# Patient Record
Sex: Male | Born: 1975 | Race: White | Hispanic: No | Smoking: Former smoker
Health system: Southern US, Community
[De-identification: ages and names within clinical notes are randomized; demographics above are authoritative.]

## PROBLEM LIST (undated history)

## (undated) ENCOUNTER — Ambulatory Visit: Admission: EM

## (undated) DIAGNOSIS — F319 Bipolar disorder, unspecified: Secondary | ICD-10-CM

## (undated) DIAGNOSIS — G8929 Other chronic pain: Secondary | ICD-10-CM

## (undated) DIAGNOSIS — M549 Dorsalgia, unspecified: Secondary | ICD-10-CM

## (undated) DIAGNOSIS — G35 Multiple sclerosis: Secondary | ICD-10-CM

## (undated) DIAGNOSIS — J40 Bronchitis, not specified as acute or chronic: Secondary | ICD-10-CM

## (undated) DIAGNOSIS — F418 Other specified anxiety disorders: Secondary | ICD-10-CM

## (undated) DIAGNOSIS — R110 Nausea: Secondary | ICD-10-CM

## (undated) HISTORY — DX: Other specified anxiety disorders: F41.8

## (undated) HISTORY — PX: OTHER SURGICAL HISTORY: SHX169

## (undated) HISTORY — DX: Other chronic pain: G89.29

## (undated) HISTORY — DX: Bronchitis, not specified as acute or chronic: J40

## (undated) HISTORY — DX: Nausea: R11.0

## (undated) HISTORY — DX: Dorsalgia, unspecified: M54.9

---

## 2003-11-02 ENCOUNTER — Encounter: Admission: RE | Admit: 2003-11-02 | Discharge: 2003-11-02 | Payer: Self-pay | Admitting: Family Medicine

## 2004-06-20 ENCOUNTER — Ambulatory Visit: Payer: Self-pay | Admitting: Family Medicine

## 2004-07-16 ENCOUNTER — Ambulatory Visit: Payer: Self-pay | Admitting: Family Medicine

## 2004-08-06 ENCOUNTER — Ambulatory Visit: Payer: Self-pay | Admitting: Family Medicine

## 2005-01-15 ENCOUNTER — Ambulatory Visit: Payer: Self-pay | Admitting: Family Medicine

## 2005-05-14 ENCOUNTER — Ambulatory Visit: Payer: Self-pay | Admitting: Family Medicine

## 2005-06-19 ENCOUNTER — Ambulatory Visit: Payer: Self-pay | Admitting: Family Medicine

## 2005-07-27 ENCOUNTER — Ambulatory Visit: Payer: Self-pay | Admitting: Family Medicine

## 2006-01-05 ENCOUNTER — Ambulatory Visit: Payer: Self-pay | Admitting: Family Medicine

## 2006-01-19 ENCOUNTER — Ambulatory Visit: Payer: Self-pay | Admitting: Family Medicine

## 2006-02-12 ENCOUNTER — Encounter: Admission: RE | Admit: 2006-02-12 | Discharge: 2006-02-12 | Payer: Self-pay | Admitting: *Deleted

## 2006-02-17 ENCOUNTER — Encounter (INDEPENDENT_AMBULATORY_CARE_PROVIDER_SITE_OTHER): Payer: Self-pay | Admitting: *Deleted

## 2006-02-17 ENCOUNTER — Ambulatory Visit (HOSPITAL_COMMUNITY): Admission: RE | Admit: 2006-02-17 | Discharge: 2006-02-17 | Payer: Self-pay | Admitting: Neurology

## 2006-02-23 ENCOUNTER — Encounter: Admission: RE | Admit: 2006-02-23 | Discharge: 2006-02-23 | Payer: Self-pay | Admitting: Neurology

## 2007-01-14 DIAGNOSIS — K219 Gastro-esophageal reflux disease without esophagitis: Secondary | ICD-10-CM

## 2007-08-13 IMAGING — CR DG CHEST 2V
2 series · 2 of 2 positions shown · non-contrast
Comparison: None

CLINICAL DATA: Bilateral arm and leg numbness.

CHEST - 2 VIEW

[view not recorded (1 of 2)]
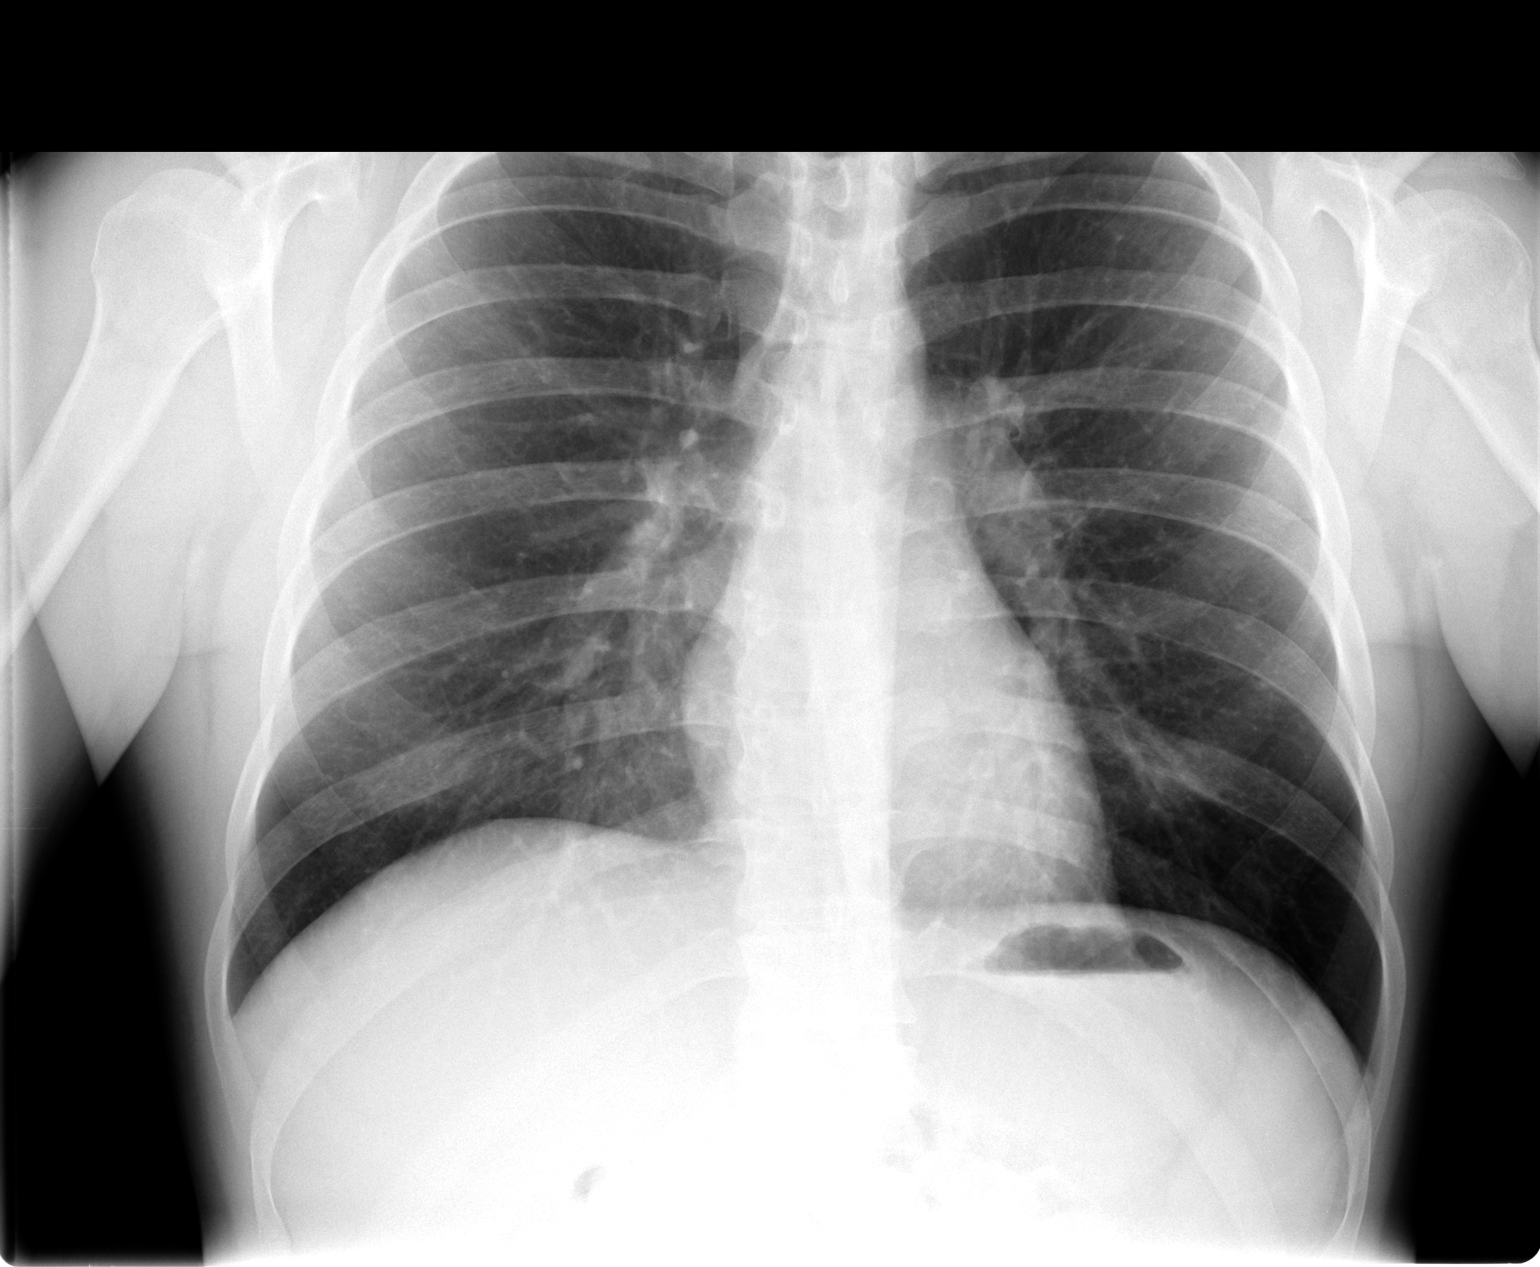

[view not recorded (2 of 2)]
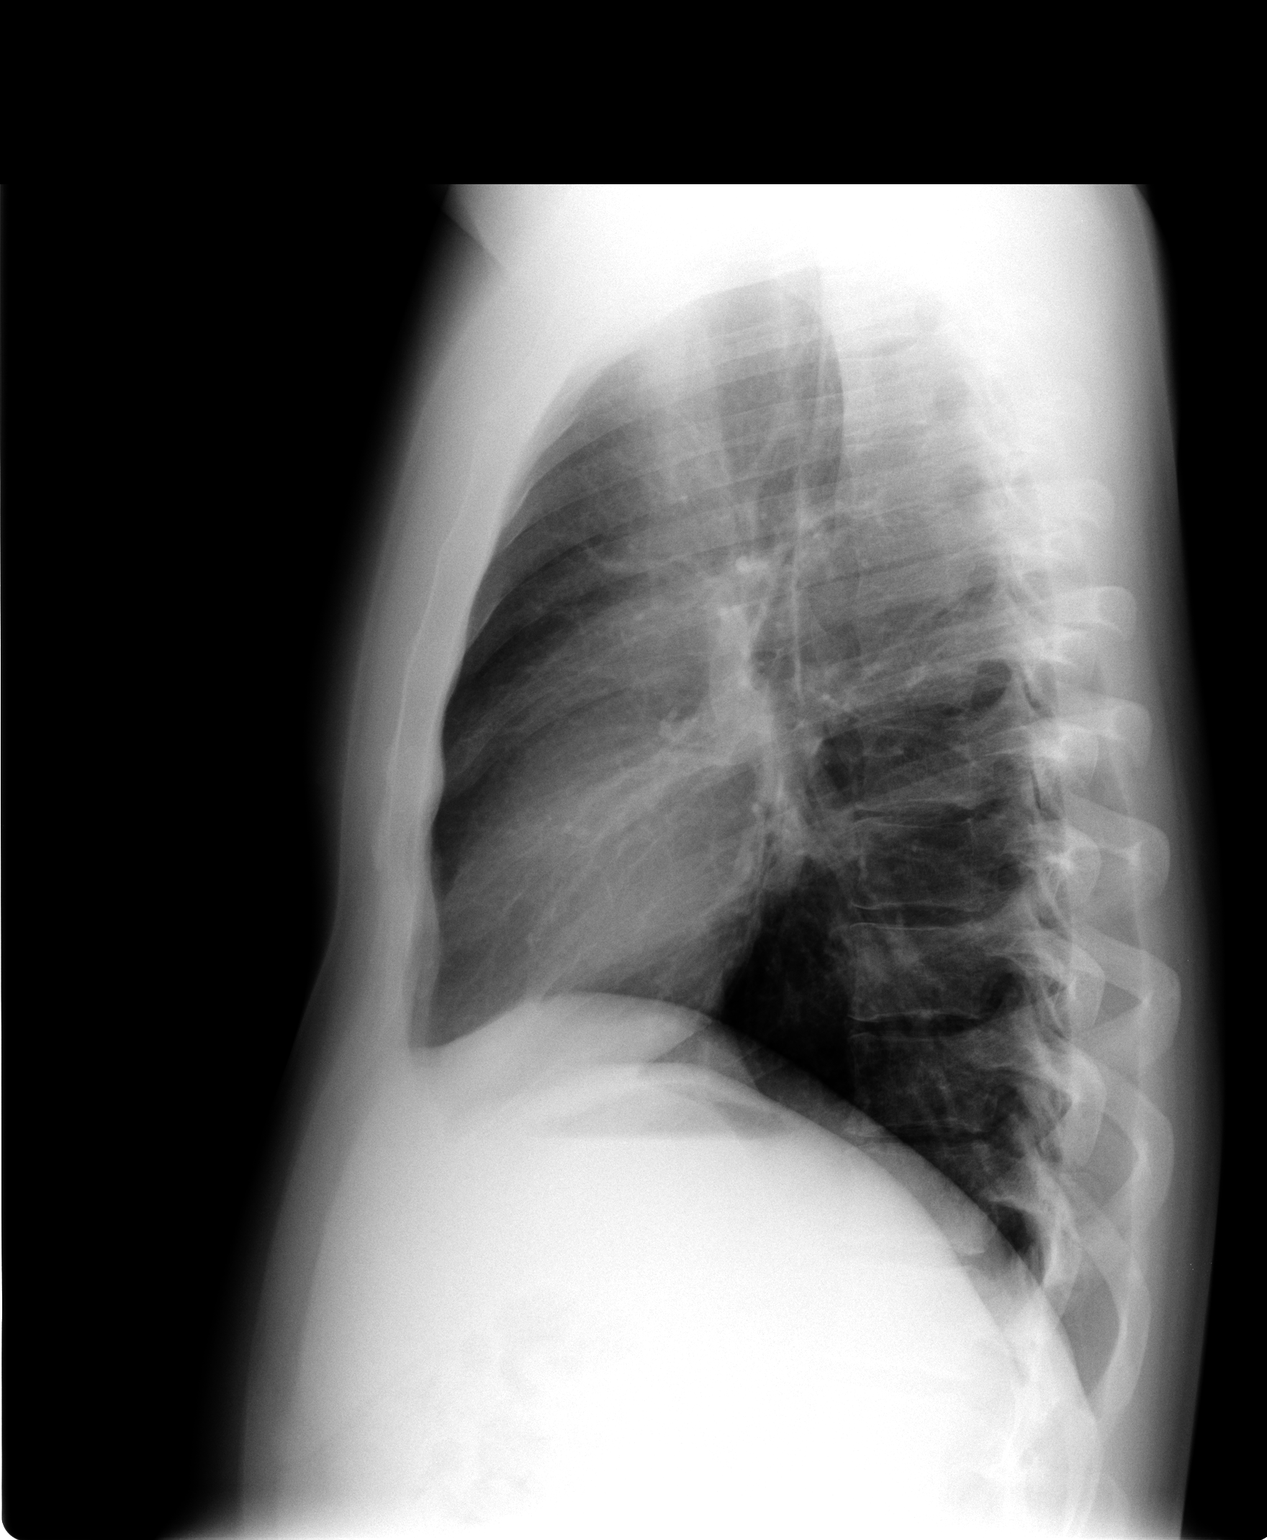

[2 of 2 positions shown; findings below may reference images not displayed]

FINDINGS: The lungs appear clear. The heart and mediastinum appear
unremarkable. No acute thoracic findings are detected.

IMPRESSION

No active cardiopulmonary disease is radiographically apparent.

## 2010-10-17 NOTE — Op Note (Signed)
NAME:  Colin Phillips, Colin Phillips          ACCOUNT NO.:  [id number]   MEDICAL RECORD NO.:  [id number]          PATIENT TYPE:  OUT   LOCATION:  [facility]                          FACILITY:  [facility]   PHYSICIAN:  Genene Churn. Love, M.D.    DATE OF BIRTH:  1975/12/09   DATE OF PROCEDURE:  02/17/2006  DATE OF DISCHARGE:                                 OPERATIVE REPORT   PROCEDURE NOTE:  The patient was prepped and draped in left lateral position  using Betadine and 1% Xylocaine. The L4-L5 interspace was entered without  difficulty.  Opening pressures 160 mm H2O.  Clear colorless CSF was obtained  and sent for studies including protein, glucose, cell count, dif,  angiotensin converting enzyme, IgG, oligoclonal IgG and cytologies.  The  patient tolerated the procedure well.           ______________________________  Genene Churn. Sandria Manly, M.D.     JML/MEDQ  D:  02/17/2006  T:  02/18/2006  Job:  [id number]

## 2011-01-17 ENCOUNTER — Emergency Department (HOSPITAL_COMMUNITY)
Admission: EM | Admit: 2011-01-17 | Discharge: 2011-01-17 | Disposition: A | Discharge status: Home / Self Care | Payer: Self-pay | Attending: Emergency Medicine | Admitting: Emergency Medicine

## 2011-01-17 ENCOUNTER — Emergency Department (HOSPITAL_COMMUNITY): Payer: Self-pay

## 2011-01-17 ENCOUNTER — Encounter: Payer: Self-pay | Admitting: *Deleted

## 2011-01-17 DIAGNOSIS — Y92009 Unspecified place in unspecified non-institutional (private) residence as the place of occurrence of the external cause: Secondary | ICD-10-CM | POA: Insufficient documentation

## 2011-01-17 DIAGNOSIS — M79609 Pain in unspecified limb: Secondary | ICD-10-CM | POA: Insufficient documentation

## 2011-01-17 DIAGNOSIS — S60229A Contusion of unspecified hand, initial encounter: Secondary | ICD-10-CM | POA: Insufficient documentation

## 2011-01-17 DIAGNOSIS — S6990XA Unspecified injury of unspecified wrist, hand and finger(s), initial encounter: Secondary | ICD-10-CM

## 2011-01-17 DIAGNOSIS — IMO0002 Reserved for concepts with insufficient information to code with codable children: Secondary | ICD-10-CM | POA: Insufficient documentation

## 2011-01-17 DIAGNOSIS — M7989 Other specified soft tissue disorders: Secondary | ICD-10-CM | POA: Insufficient documentation

## 2011-01-17 HISTORY — DX: Multiple sclerosis: G35

## 2011-01-17 HISTORY — DX: Bipolar disorder, unspecified: F31.9

## 2011-01-17 MED ORDER — OXYCODONE-ACETAMINOPHEN 5-325 MG PO TABS: 1.0000 | ORAL_TABLET | ORAL | Status: AC | PRN

## 2011-01-17 NOTE — ED Notes (Signed)
Pt states he was upset last pm and punched a wall with his right hand.

## 2011-01-17 NOTE — ED Provider Notes (Signed)
Scribed for [employer]. Colin Payor, MD, the patient was seen in room APA03/APA03. This chart was scribed by Colin Phillips. This patient's care was started at 8:57AM.  History     CSN: [id number] Arrival date & time: 01/17/2011  8:55 AM  Chief Complaint  Patient presents with  . Hand Injury   The history is provided by the patient.   Colin Phillips is a 35 y.o. male who presents to the Emergency Department complaining of a right hand injury resulting from punching the wall last night with associated pain and swelling, which is aggravated by palpations over the 4th and 5th metacarpals of the right hand. Denies numbness and tingling on the right hand. Patient has a history of multiple sclerosis.   HPI ELEMENTS: Location: 4th and 5th metacarpals of the right hand. Onset: Last night Duration: Persistent since the onset.  Timing: Constant  Modifying factors: Palpation on the4th and 5th metacarpals of the right hand. Context: as above  Associated symptoms: Associated pain and swelling, denies numbness and tingling on the right hand.    PAST MEDICAL HISTORY:  Past Medical History  Diagnosis Date  . Multiple sclerosis   . Bipolar 1 disorder     PAST SURGICAL HISTORY:  History reviewed. No pertinent past surgical history.  MEDICATIONS:  Previous Medications   ACETAMINOPHEN (TYLENOL) 325 MG TABLET    Take 650 mg by mouth every 6 (six) hours as needed. For pain    ALPRAZOLAM (XANAX) 1 MG TABLET    Take 1 mg by mouth 3 (three) times daily as needed. For anxiety   INTERFERON BETA-1A (AVONEX PEN) 30 MCG/0.5ML INJECTION    Inject 30 mcg into the muscle every 7 (seven) days.       ALLERGIES:  Allergies as of 01/17/2011  . (No Known Allergies)     FAMILY HISTORY:  Family History  Problem Relation Age of Onset  . Cancer Mother      SOCIAL HISTORY: History   Social History  . Marital Status: Single    Spouse Name: N/A    Number of Children: N/A  . Years of Education: N/A     Social History Main Topics  . Smoking status: Former Smoker    Types: Cigarettes  . Smokeless tobacco: None  . Alcohol Use: No  . Drug Use: No  . Sexually Active:    Other Topics Concern  . None   Social History Narrative  . None     Review of Systems  10 Systems reviewed and are negative for acute change except as noted in the HPI.   Physical Exam  BP 131/83  Pulse 90  Temp(Src) 98.3 F (36.8 C) (Oral)  Resp 16  Ht 5\' 7"  (1.702 m)  Wt 180 lb (81.647 kg)  BMI 28.19 kg/m2  SpO2 99%  Physical Exam  Nursing note and vitals reviewed. Constitutional: He is oriented to person, place, and time. He appears well-developed and well-nourished. No distress.  HENT:  Head: Normocephalic and atraumatic.  Eyes: EOM are normal.  Neck: Neck supple.  Cardiovascular: Normal rate.   Pulmonary/Chest: Effort normal.  Musculoskeletal: Normal range of motion. He exhibits tenderness.       Right wrist: He exhibits tenderness and swelling.       Tenderness and swelling on the 4th and 5th metacarpals of the right hand. Mild bruising to the right hand. No pain with ROM of the right wrist. No pain in the ROM of the elbow. Sensation and cap refill  intact in the right hand.  Neurological: He is alert and oriented to person, place, and time. No sensory deficit.  Skin: Skin is warm and dry. He is not diaphoretic.  Psychiatric: He has a normal mood and affect. His behavior is normal.    ED Course  Procedures  MDM OTHER DATA REVIEWED: Nursing notes, vital signs, and past medical records reviewed. Imaging results reviewed and considered  DIAGNOSTIC STUDIES: Oxygen Saturation is 99% on room air, normal by my interpretation.    LABS / RADIOLOGY: No results found for this or any previous visit. Dg Hand Complete Right  01/17/2011  *RADIOLOGY REPORT*  Clinical Data: Trauma, right hand pain  RIGHT HAND - COMPLETE 3+ VIEW  Comparison: None.  Findings: Mild soft tissue swelling is noted medial  to the fifth metacarpal but no underlying osseous abnormality is seen.  No fracture or dislocation.  No radiopaque foreign body.  IMPRESSION: Soft tissue swelling adjacent to the fifth metacarpal, but no underlying acute osseous finding.  Original Report Authenticated By: Colin Phillips, M.D.    PROCEDURES:  ED COURSE / COORDINATION OF CARE: Orders Placed This Encounter  Procedures  . DG Hand Complete Right  . Splint wrist  9:28- Informed patient of imaging results which did not find any osseous abnormalities. Discussed at home care instructions and intent to d/c home. Patient agrees with plan set forth   MDM: Right hand swelling after punching wall. Xray no fracture. Splint for comfort. Ortho follow up as needed.   PLAN: Discharge The patient is to return the emergency department if there is any worsening of symptoms. I have reviewed the discharge instructions with the patient/family  CONDITION ON DISCHARGE: Good, Stable   MEDICATIONS GIVEN IN THE E.D.  Medications  ALPRAZolam (XANAX) 1 MG tablet (not administered)  acetaminophen (TYLENOL) 325 MG tablet (not administered)  interferon beta-1a (AVONEX PEN) 30 MCG/0.5ML injection (not administered)     I personally performed the services described in this documentation, which was scribed in my presence. The recorded information has been reviewed and considered. [name]. Colin Payor, MD        [name]. Colin Payor, MD 01/17/11 [phone]

## 2011-01-17 NOTE — ED Notes (Signed)
Pt c/o pain in his right hand since punching a wall last night. Swelling noted to right hand. Strong radial pulse palpated. States that it hurts to move his wrist also.

## 2012-01-06 ENCOUNTER — Ambulatory Visit: Payer: Self-pay | Admitting: Family Medicine

## 2013-04-25 ENCOUNTER — Telehealth: Payer: Self-pay | Admitting: *Deleted

## 2013-04-26 MED ORDER — INTERFERON BETA-1A 30 MCG/0.5ML IM KIT: 30.0000 ug | PACK | INTRAMUSCULAR | Status: DC

## 2013-04-26 NOTE — Telephone Encounter (Signed)
Please advise 

## 2013-05-03 NOTE — Telephone Encounter (Signed)
Please advise 

## 2013-05-08 ENCOUNTER — Encounter: Payer: Self-pay | Admitting: Neurology

## 2013-05-19 ENCOUNTER — Ambulatory Visit: Payer: Self-pay | Admitting: Neurology

## 2013-06-08 ENCOUNTER — Encounter: Payer: Self-pay | Admitting: Neurology

## 2013-06-09 ENCOUNTER — Ambulatory Visit: Payer: Self-pay | Admitting: Neurology

## 2013-06-22 ENCOUNTER — Encounter (INDEPENDENT_AMBULATORY_CARE_PROVIDER_SITE_OTHER): Payer: Self-pay

## 2013-06-22 ENCOUNTER — Ambulatory Visit (INDEPENDENT_AMBULATORY_CARE_PROVIDER_SITE_OTHER): Payer: Self-pay | Admitting: Neurology

## 2013-06-22 ENCOUNTER — Encounter: Payer: Self-pay | Admitting: Neurology

## 2013-06-22 VITALS — BP 124/83 | HR 91 | Ht 66.0 in | Wt 147.0 lb

## 2013-06-22 DIAGNOSIS — G35 Multiple sclerosis: Secondary | ICD-10-CM

## 2013-06-22 DIAGNOSIS — F319 Bipolar disorder, unspecified: Secondary | ICD-10-CM

## 2013-06-22 DIAGNOSIS — K219 Gastro-esophageal reflux disease without esophagitis: Secondary | ICD-10-CM

## 2013-06-22 DIAGNOSIS — M549 Dorsalgia, unspecified: Secondary | ICD-10-CM

## 2013-06-22 DIAGNOSIS — G8929 Other chronic pain: Secondary | ICD-10-CM | POA: Insufficient documentation

## 2013-06-22 DIAGNOSIS — F418 Other specified anxiety disorders: Secondary | ICD-10-CM

## 2013-06-22 NOTE — Progress Notes (Signed)
[facility]  PATIENT: Colin Phillips DOB: 18-Oct-1975  HISTORICAL Colin Phillips is a 38 years old right-handed Caucasian male, followup for relapsing remitting multiple sclerosis, he was a patient of Colin Phillips, last clinical visit was December 2013  He has long-standing history of chronic Colin Phillips back pain and long-standing depression. His depression and bipolar disease are treated by Dr. Candis Phillips.   During previous visit, Dr. low documented that he was taking Vicodin 5/500 t.i.d. for nonradiating back pain. He states that he take as many as 10 per day. He also has a tendency to ask him for pain medications, using multiple prescribers,   He supposed to take Avonex for his multiple sclerosis, but never compliant with his medications, he was missing 12 third of his Avonex shots there was no significant worsening of his MS symptoms there was no imaging study found in the system,    He is independent in activities of daily living, He last worked in 2009. He lives with his father. 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   He finished high school.He states that he has a felony conviction  related to selling drugs when he was a teenager.   he came back complains of worsening Colin Phillips back pain, pain all over, difficlty sleep, numbness in his feet and hands, There was no significant worsening of his functional status, he drives car, ambulate without assistance, normal bowel bladder incontinence   REVIEW OF SYSTEMS: Full 14 system review of systems performed and notable only for Colin Phillips back pain, diffuse body aching pain   ALLERGIES: No Known Allergies  HOME MEDICATIONS: Outpatient Prescriptions Prior to Visit  Medication Sig Dispense Refill  . acetaminophen (TYLENOL) 325 MG tablet Take 650 mg by mouth every 6 (six) hours as needed. For pain       . ALPRAZolam (XANAX) 1 MG tablet Take 1 mg by mouth 3 (three) times daily as needed. For anxiety      . baclofen (LIORESAL) 10 MG tablet Take 10 mg  by mouth 3 (three) times daily. Take 1/2 tablet      . interferon beta-1a (AVONEX PEN) 30 MCG/0.5ML injection Inject 0.5 mLs (30 mcg total) into the muscle every 7 (seven) days.  1 kit  1  . lamoTRIgine (LAMICTAL) 100 MG tablet Take 100 mg by mouth daily.      . risperidone (RISPERDAL) 4 MG tablet Take 4 mg by mouth at bedtime.      Marland Kitchen zolpidem (AMBIEN) 10 MG tablet Take 10 mg by mouth at bedtime as needed for sleep.       No facility-administered medications prior to visit.    PAST MEDICAL HISTORY: Past Medical History  Diagnosis Date  . Multiple sclerosis   . Bipolar 1 disorder   . Chronic back pain   . Depression with anxiety   . Nausea     PAST SURGICAL HISTORY: Past Surgical History  Procedure Laterality Date  . None      FAMILY HISTORY: Family History  Problem Relation Age of Onset  . Cancer Mother   . Cancer      grandfather  . Cancer Cousin     SOCIAL HISTORY:  History   Social History  . Marital Status: Single    Spouse Name: N/A    Number of Children: 0  . Years of Education: 12   Occupational History  .      unemployed   Social History Main Topics  . Smoking status: Current Every Day  Smoker    Types: Cigars  . Smokeless tobacco: Never Used  . Alcohol Use: No  . Drug Use: No  . Sexual Activity: Not on file   Other Topics Concern  . Not on file   Social History Narrative   Patient is unemployed.    Patient lives at home with his father, niece and nephew.   Patient is right handed.   Patient drinks sodas mountain dews     PHYSICAL EXAM   Filed Vitals:   06/22/13 1217  BP: 124/83  Pulse: 91  Height: '5\' 6"'  (1.676 m)  Weight: 147 lb (66.679 kg)    Not recorded    Body mass index is 23.74 kg/(m^2).   Generalized: In no acute distress  Neck: Supple, no carotid bruits   Cardiac: Regular rate rhythm  Pulmonary: Clear to auscultation bilaterally  Musculoskeletal: No deformity  Neurological examination  Mentation: Alert  oriented to time, place, history taking, and causual conversation, depressed, unkempted  Cranial nerve II-XII: Pupils were equal round reactive to light extraocular movements were full, Visual field were full on confrontational test. Bilateral fundi were sharp.  Facial sensation and strength were normal. Hearing was intact to finger rubbing bilaterally. Uvula tongue midline.  head turning and shoulder shrug and were normal and symmetric.Tongue protrusion into cheek strength was normal.  Motor: normal tone, bulk and strength.  Sensory: Intact to fine touch, pinprick, preserved vibratory sensation, and proprioception at toes.  Coordination: Normal finger to nose, heel-to-shin bilaterally there was no truncal ataxia  Gait: Rising up from seated position without assistance, normal stance, cautious, guarding his back, without trunk ataxia, moderate stride, good arm swing, smooth turning, able to perform tiptoe, and heel walking without difficulty.   Romberg signs: Negative  Deep tendon reflexes: Brachioradialis 2/2, biceps 2/2, triceps 2/2, patellar 2/2, Achilles 2/2, plantar responses were flexor bilaterally.   DIAGNOSTIC DATA (LABS, IMAGING, TESTING) - I reviewed patient records, labs, notes, testing and imaging myself where available.   ASSESSMENT AND PLAN   38 years old male, with history of relapsing remitting multiple sclerosis, not compliant with his Avonex shots, has not had imaging for many years, clinically stable, complains of chronic Colin Phillips back pain,     1 Before committed him to long-term immunomodulation therapy, I will repeat MRI of the brain, and cervical spine with and without contrast 2 return to clinic in one month to review the film, and decide on treatment plan    Marcial Pacas, M.D. Ph.D.  [facility] [address], [place], [place] [address] [phone]

## 2013-10-13 ENCOUNTER — Telehealth: Payer: Self-pay | Admitting: Neurology

## 2013-10-13 NOTE — Telephone Encounter (Signed)
Pt calling stating that he wanted to make Dr. Krista Blue aware that he is having numbness in his hands and feet and also inquiring about his Avonex refill. Please advise

## 2013-10-13 NOTE — Telephone Encounter (Signed)
Patient says he is having numbness in hands and feet and wanted to make provider aware. Avonex has been refilled.

## 2013-10-13 NOTE — Telephone Encounter (Signed)
Please call him,    I saw him in Jan 2015, suppose to come back in one month per office note, the appt is in Oct 2015, please call patient, if he has problems, he should have MRI brain and cervical as scheduled and return soon to review films for further discussion

## 2013-10-13 NOTE — Telephone Encounter (Signed)
Patient called stating that he is having numbness in his hands and feet and just wanted to be sure that Dr. Krista Blue is aware. He also is asking about whether his Avonex refill has been approved. Please call to advise.

## 2013-10-17 NOTE — Telephone Encounter (Signed)
Pt called and LMVM for him re: below.  Needing MRI brain and C spine prior to being seen.  Spoke to [place] in [place].  She stated that pt was contacted by [facility] about scheduling.  Cost factor since pt self pay.  MS society?  Awaiting pts call back.

## 2013-10-19 NOTE — Telephone Encounter (Signed)
I called and LMVM for pt again.  To call back if still needed.

## 2013-12-21 ENCOUNTER — Ambulatory Visit: Payer: Self-pay | Admitting: Neurology

## 2014-03-13 ENCOUNTER — Ambulatory Visit: Payer: Self-pay | Admitting: Neurology

## 2014-09-12 ENCOUNTER — Telehealth: Payer: Self-pay | Admitting: Neurology

## 2014-09-12 DIAGNOSIS — G35 Multiple sclerosis: Secondary | ICD-10-CM

## 2014-09-12 NOTE — Telephone Encounter (Signed)
Pt is calling stating he just got insurance and wants to see about getting his MRI's rescheduled. Please call and advise.

## 2014-09-12 NOTE — Telephone Encounter (Signed)
He will need new orders for MRIs.  He has a pending follow up appt on 10/11/14.

## 2014-09-13 NOTE — Telephone Encounter (Signed)
MRI brain and cervical w/wo were placed.

## 2014-09-13 NOTE — Addendum Note (Signed)
Addended by: Marcial Pacas on: 09/13/2014 08:34 AM   Modules accepted: Orders

## 2014-09-21 ENCOUNTER — Ambulatory Visit
Admission: RE | Admit: 2014-09-21 | Discharge: 2014-09-21 | Disposition: A | Discharge status: Home / Self Care | Payer: 59 | Source: Ambulatory Visit | Attending: Neurology | Admitting: Neurology

## 2014-09-21 DIAGNOSIS — G35 Multiple sclerosis: Secondary | ICD-10-CM

## 2014-09-21 MED ORDER — GADOBENATE DIMEGLUMINE 529 MG/ML IV SOLN
15.0000 mL | Freq: Once | INTRAVENOUS | Status: AC | PRN
  Administered 2014-09-21: 15 mL via INTRAVENOUS

## 2014-09-24 NOTE — Telephone Encounter (Signed)
Spoke to patient concerning MRI.  He will keep his follow up appt to further discuss.r

## 2014-09-24 NOTE — Telephone Encounter (Signed)
Please call patient,   MRI brain: mild low lying brain, otherwise normal.  MRI cervical spine:  Single lesion at posterior C4. Otherwise normal.  He should keep follow up appt.  MRI of the cervical spine with and without contrast showing a single focus within the posterior spinal cord adjacent to the C4 vertebral body. This could be consistent with a chronic demyelinating plaque from transverse myelitis or MS. There are no acute findings. Additionally, there are mild disc degenerative changes at C4-C5 and C5-C6 that do not lead to any nerve root impingement.  1. 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 2. The brain parenchyma appears normal. There are no acute findings.This MRI of the brain with and without contrast shows the following: 1. 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 2. The brain parenchyma appears normal. There are no acute findings.

## 2014-10-11 ENCOUNTER — Other Ambulatory Visit: Payer: Self-pay | Admitting: *Deleted

## 2014-10-11 ENCOUNTER — Ambulatory Visit (INDEPENDENT_AMBULATORY_CARE_PROVIDER_SITE_OTHER): Payer: 59 | Admitting: Neurology

## 2014-10-11 ENCOUNTER — Encounter: Payer: Self-pay | Admitting: Neurology

## 2014-10-11 VITALS — BP 126/83 | HR 91 | Ht 66.0 in | Wt 159.0 lb

## 2014-10-11 DIAGNOSIS — G35 Multiple sclerosis: Secondary | ICD-10-CM

## 2014-10-11 DIAGNOSIS — R350 Frequency of micturition: Secondary | ICD-10-CM

## 2014-10-11 MED ORDER — GABAPENTIN 300 MG PO CAPS: 300.0000 mg | ORAL_CAPSULE | Freq: Three times a day (TID) | ORAL | Status: DC

## 2014-10-11 NOTE — Progress Notes (Signed)
Chief Complaint  Patient presents with  . Multiple Sclerosis    He would like to further discuss his MRI results. He has multiple concerns today.  He is having headaches, numbness, tingling, muscle spasms, back pain, urinary frequency and visual disturbances.      [facility]  PATIENT: Colin Phillips DOB: 1976/03/17  HISTORICAL [name] is a 39 years old right-handed Caucasian male, followup for relapsing remitting multiple sclerosis since 2007, he was a patient of Dr. Erling Cruz, last clinical visit was December 2013, I saw him in Jan 2015.  He has long-standing history of chronic low back pain and long-standing depression. His depression and bipolar disease are treated by Dr. Candis Schatz.   During previous visit, Dr. Erling Cruz documented that he was taking Vicodin 5/500 t.i.d. for nonradiating back pain. He states that he take as many as 10 per day. He also has a tendency to ask him for pain medications, using multiple prescribers,   He supposed to take Avonex for his multiple sclerosis, but never compliant with his medications, he was missing 12 third of his Avonex shots there was no significant worsening of his MS symptoms there was no imaging study found in the system.   He is independent in activities of daily living, He last worked in 2009. He lives with his father. 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   He finished high school.He states that he has a felony conviction  related to selling drugs when he was a teenager.  He came back complains of worsening low back pain, pain all over, difficulty sleep, numbness in his feet and hands, There was no significant worsening of his functional status, he drives car, ambulate without assistance, normal bowel bladder incontinence  UPDATE Oct 11 2014: He missed few appointment since initial visit in Jan 2015, he now complains of numbness involving his hand, legs, even at his face, genital area, skull, hands achiness, thigh muscle spasm,  knee pain, xanax helps his muscle spasm. He complains of difficulty sleeping because of his low back pain, could not tolerate any strenuous activities, fatigue, feel like carry twice his weight. He also complains of urinary frequency, nocturia, he was evaluated by urologist in the past. He complains of frequent headaches, triggered by bright light, if he moves his eyes, he sees black dots moving in his visual field. He questioning wether he needs to continue on Avenox, e misses shot occasionallly, does have flu like illness, lasting for 12-24 hours  I have reviewed MRI with patient April 20 second 2016:  MRI of brain, mild Arnold-Chiari malformation, no MS lesions' MRI cervical spine, single posterior C4 lesions no contrast enhancement   REVIEW OF SYSTEMS: Full 14 system review of systems performed and notable only for low back pain, diffuse body aching pain   ALLERGIES: No Known Allergies  HOME MEDICATIONS: Outpatient Prescriptions Prior to Visit  Medication Sig Dispense Refill  . ALPRAZolam (XANAX) 1 MG tablet Take 1 mg by mouth 3 (three) times daily as needed. For anxiety    . interferon beta-1a (AVONEX PEN) 30 MCG/0.5ML injection Inject 0.5 mLs (30 mcg total) into the muscle every 7 (seven) days. 1 kit 1  . lamoTRIgine (LAMICTAL) 100 MG tablet Take 100 mg by mouth daily.    . risperidone (RISPERDAL) 4 MG tablet Take 4 mg by mouth at bedtime.    Marland Kitchen zolpidem (AMBIEN) 10 MG tablet Take 10 mg by mouth at bedtime as needed for sleep.    Marland Kitchen acetaminophen (TYLENOL) 325  MG tablet Take 650 mg by mouth every 6 (six) hours as needed. For pain     . baclofen (LIORESAL) 10 MG tablet Take 10 mg by mouth 3 (three) times daily. Take 1/2 tablet    . Hydrocodone-Acetaminophen (VICODIN) 5-300 MG TABS Take by mouth as needed.     No facility-administered medications prior to visit.    PAST MEDICAL HISTORY: Past Medical History  Diagnosis Date  . Multiple sclerosis   . Bipolar 1 disorder   . Chronic  back pain   . Depression with anxiety   . Nausea     PAST SURGICAL HISTORY: Past Surgical History  Procedure Laterality Date  . None      FAMILY HISTORY: Family History  Problem Relation Age of Onset  . Cancer Mother   . Cancer      grandfather  . Cancer Cousin     SOCIAL HISTORY:  History   Social History  . Marital Status: Single    Spouse Name: N/A  . Number of Children: 0  . Years of Education: 12   Occupational History  .      unemployed   Social History Main Topics  . Smoking status: Current Every Day Smoker    Types: Cigars  . Smokeless tobacco: Never Used  . Alcohol Use: No  . Drug Use: No  . Sexual Activity: Not on file   Other Topics Concern  . Not on file   Social History Narrative   Patient is unemployed.    Patient lives at home with his father, niece and nephew.   Patient is right handed.   Patient drinks sodas [employer]     PHYSICAL EXAM   Filed Vitals:   10/11/14 1106  BP: 126/83  Pulse: 91  Height: '5\' 6"'  (1.676 m)  Weight: 159 lb (72.122 kg)    Not recorded      Body mass index is 25.68 kg/(m^2).   Generalized: In no acute distress  Neck: Supple, no carotid bruits   Cardiac: Regular rate rhythm  Pulmonary: Clear to auscultation bilaterally  Musculoskeletal: No deformity  Neurological examination  Mentation: Alert oriented to time, place, history taking, and causual conversation, depressed, unkempted  Cranial nerve II-XII: Pupils were equal round reactive to light extraocular movements were full, Visual field were full on confrontational test. Bilateral fundi were sharp.  Facial sensation and strength were normal. Hearing was intact to finger rubbing bilaterally. Uvula tongue midline.  head turning and shoulder shrug and were normal and symmetric.Tongue protrusion into cheek strength was normal.  Motor: normal tone, bulk and strength.  Sensory: Intact to fine touch, pinprick, preserved vibratory sensation, and  proprioception at toes.  Coordination: Normal finger to nose, heel-to-shin bilaterally there was no truncal ataxia  Gait: Rising up from seated position without assistance, normal stance, cautious, guarding his back, without trunk ataxia, moderate stride, good arm swing, smooth turning, able to perform tiptoe, and heel walking without difficulty.   Romberg signs: Negative  Deep tendon reflexes: Brachioradialis 2/2, biceps 2/2, triceps 2/2, patellar 2/2, Achilles 2/2, plantar responses were flexor bilaterally.   DIAGNOSTIC DATA (LABS, IMAGING, TESTING) - I reviewed patient records, labs, notes, testing and imaging myself where available.   ASSESSMENT AND PLAN   39 years old male, with history of relapsing remitting multiple sclerosis, not compliant with his Avonex shots, MRI of the brain April 2016, with without contrast showed no MS lesions, MRI of cervical showed single isolated lesions at posterior C4, no  contrast enhancement  1, He only has one clinical event, no clear relapsing remediating over the past few years, Continue Avonex, premedicate with ibuprofen, or Tylenol 2 return to clinic in 6 months with Rhae Hammock, M.D. Ph.D.  [facility] [address] [place], [place] [address]  [phone]

## 2015-01-07 ENCOUNTER — Telehealth (HOSPITAL_COMMUNITY): Payer: Self-pay | Admitting: *Deleted

## 2015-02-05 ENCOUNTER — Telehealth: Payer: Self-pay | Admitting: Neurology

## 2015-02-05 MED ORDER — INTERFERON BETA-1A 30 MCG/0.5ML IM AJKT: 30.0000 ug | AUTO-INJECTOR | INTRAMUSCULAR | Status: DC

## 2015-02-05 NOTE — Telephone Encounter (Signed)
Rx has been sent.  Receipt confirmed by pharmacy.   

## 2015-02-05 NOTE — Telephone Encounter (Signed)
Patient is calling and states that he now has [employer] and they are telling him he needs a Rx Avonex Pen 30 mcg/0.5 sent to [facility] or called in to [phone].  Thanks!

## 2015-02-27 ENCOUNTER — Ambulatory Visit: Payer: 59 | Admitting: Urology

## 2015-04-16 ENCOUNTER — Ambulatory Visit: Payer: 59 | Admitting: Nurse Practitioner

## 2015-04-17 ENCOUNTER — Encounter: Payer: Self-pay | Admitting: Nurse Practitioner

## 2015-07-11 ENCOUNTER — Other Ambulatory Visit: Payer: Self-pay

## 2015-07-11 MED ORDER — INTERFERON BETA-1A 30 MCG/0.5ML IM AJKT: 30.0000 ug | AUTO-INJECTOR | INTRAMUSCULAR | Status: DC

## 2015-09-24 ENCOUNTER — Telehealth: Payer: Self-pay | Admitting: Neurology

## 2015-09-24 NOTE — Telephone Encounter (Signed)
[place] [phone] called to advise, they have been unable to reach patient at listed phone number, patient has not responded to their calls regarding refill of Interferon Beta-1a (AVONEX PEN) 30 MCG/0.5ML AJKT.

## 2016-02-11 ENCOUNTER — Encounter (HOSPITAL_COMMUNITY): Payer: Self-pay | Admitting: Emergency Medicine

## 2016-02-11 ENCOUNTER — Emergency Department (HOSPITAL_COMMUNITY)
Admission: EM | Admit: 2016-02-11 | Discharge: 2016-02-11 | Disposition: A | Discharge status: Home / Self Care | Payer: 59 | Attending: Emergency Medicine | Admitting: Emergency Medicine

## 2016-02-11 ENCOUNTER — Emergency Department (HOSPITAL_COMMUNITY): Payer: 59

## 2016-02-11 DIAGNOSIS — J40 Bronchitis, not specified as acute or chronic: Secondary | ICD-10-CM | POA: Insufficient documentation

## 2016-02-11 DIAGNOSIS — F17290 Nicotine dependence, other tobacco product, uncomplicated: Secondary | ICD-10-CM | POA: Insufficient documentation

## 2016-02-11 DIAGNOSIS — R0789 Other chest pain: Secondary | ICD-10-CM

## 2016-02-11 DIAGNOSIS — R079 Chest pain, unspecified: Secondary | ICD-10-CM | POA: Diagnosis present

## 2016-02-11 MED ORDER — DICLOFENAC SODIUM 50 MG PO TBEC: 50.0000 mg | DELAYED_RELEASE_TABLET | Freq: Two times a day (BID) | ORAL | 0 refills | Status: DC

## 2016-02-11 MED ORDER — AZITHROMYCIN 250 MG PO TABS: 250.0000 mg | ORAL_TABLET | Freq: Every day | ORAL | 0 refills | Status: DC

## 2016-02-11 MED ORDER — HYDROCODONE-ACETAMINOPHEN 5-325 MG PO TABS
2.0000 | ORAL_TABLET | ORAL | 0 refills | Status: DC | PRN
Start: 2016-02-11 — End: 2016-05-18

## 2016-02-11 NOTE — ED Triage Notes (Signed)
Pt c/o left rib pain after doing push ups last week. Pt states pain is worse with movement, deep breath, sneezing. nad noted.

## 2016-02-11 NOTE — ED Notes (Signed)
Patient given discharge instruction, verbalized understand. Patient ambulatory out of the department.  

## 2016-02-11 NOTE — ED Notes (Signed)
PA at the bedside to discuss plan

## 2016-02-22 NOTE — ED Provider Notes (Signed)
[facility] DEPT Provider Note   CSN: [id number] Arrival date & time: 02/11/16  1312     History   Chief Complaint Chief Complaint  Patient presents with  . Chest Pain  Pt complains of pain in his chest after doing pushups last week.  Pt reports pain is becoming worse not improving.  Pain with breathins  HPI Colin Phillips is a 40 y.o. male.  The history is provided by the patient. No language interpreter was used.  Chest Pain   This is a new problem. The current episode started more than 1 week ago. The problem occurs constantly. The problem has been gradually worsening. The pain is associated with exertion, lifting and raising an arm. The pain is moderate. The quality of the pain is described as brief. The pain does not radiate. The symptoms are aggravated by certain positions. He has tried nothing for the symptoms. The treatment provided no relief. There are no known risk factors.  Pertinent negatives for past medical history include no hypertension.    Past Medical History:  Diagnosis Date  . Bipolar 1 disorder ([place])   . Chronic back pain   . Depression with anxiety   . Multiple sclerosis ([place])   . Nausea     Patient Active Problem List   Diagnosis Date Noted  . Multiple sclerosis ([place])   . Bipolar 1 disorder ([place])   . Chronic back pain   . Depression with anxiety   . GERD 01/14/2007    Past Surgical History:  Procedure Laterality Date  . None         Home Medications    Prior to Admission medications   Medication Sig Start Date End Date Taking? Authorizing Provider  ALPRAZolam Duanne Moron) 1 MG tablet Take 1 mg by mouth 3 (three) times daily as needed. For anxiety    Historical Provider, MD  azithromycin (ZITHROMAX) 250 MG tablet Take 1 tablet (250 mg total) by mouth daily. Take first 2 tablets together, then 1 every day until finished. 02/11/16   Fransico Meadow, PA-C  azithromycin (ZITHROMAX) 250 MG tablet Take 1 tablet (250 mg total) by mouth daily.  Take first 2 tablets together, then 1 every day until finished. 02/11/16   Fransico Meadow, PA-C  diclofenac (CATAFLAM) 50 MG tablet Take 50 mg by mouth 2 (two) times daily.    Historical Provider, MD  diclofenac (VOLTAREN) 50 MG EC tablet Take 1 tablet (50 mg total) by mouth 2 (two) times daily. 02/11/16   Fransico Meadow, PA-C  gabapentin (NEURONTIN) 300 MG capsule Take 1 capsule (300 mg total) by mouth 3 (three) times daily. 10/11/14   Marcial Pacas, MD  HYDROcodone-acetaminophen (NORCO/VICODIN) 5-325 MG tablet Take 2 tablets by mouth every 4 (four) hours as needed. 02/11/16   Fransico Meadow, PA-C  ibuprofen (ADVIL,MOTRIN) 200 MG tablet Take 200 mg by mouth every 6 (six) hours as needed.    Historical Provider, MD  Interferon Beta-1a (AVONEX PEN) 30 MCG/0.5ML AJKT Inject 30 mcg into the muscle every 7 (seven) days. 07/11/15   Marcial Pacas, MD  lamoTRIgine (LAMICTAL) 100 MG tablet Take 100 mg by mouth daily.    Historical Provider, MD  risperidone (RISPERDAL) 4 MG tablet Take 4 mg by mouth at bedtime.    Historical Provider, MD  zolpidem (AMBIEN) 10 MG tablet Take 10 mg by mouth at bedtime as needed for sleep.    Historical Provider, MD    Family History Family History  Problem Relation  Age of Onset  . Cancer Mother   . Cancer      grandfather  . Cancer Cousin     Social History Social History  Substance Use Topics  . Smoking status: Current Every Day Smoker    Types: Cigars  . Smokeless tobacco: Never Used  . Alcohol use No     Allergies   Review of patient's allergies indicates no known allergies.   Review of Systems Review of Systems  Cardiovascular: Positive for chest pain.  All other systems reviewed and are negative.    Physical Exam Updated Vital Signs BP 130/88 (BP Location: Right Arm)   Pulse 91   Temp 98.8 F (37.1 C) (Oral)   Resp 20   Ht 5\' 7"  (1.702 m)   Wt 77.1 kg   SpO2 95%   BMI 26.63 kg/m   Physical Exam  Constitutional: He appears well-developed and  well-nourished.  HENT:  Head: Normocephalic and atraumatic.  Eyes: Conjunctivae are normal.  Neck: Neck supple.  Cardiovascular: Normal rate and regular rhythm.   No murmur heard. Pulmonary/Chest: Effort normal and breath sounds normal. No respiratory distress. He exhibits tenderness.  Abdominal: Soft. There is no tenderness.  Musculoskeletal: He exhibits no edema.  Neurological: He is alert.  Skin: Skin is warm and dry.  Psychiatric: He has a normal mood and affect.  Nursing note and vitals reviewed.    ED Treatments / Results  Labs (all labs ordered are listed, but only abnormal results are displayed) Labs Reviewed - No data to display  EKG  EKG Interpretation  Date/Time:  Tuesday February 11 2016 13:23:34 EDT Ventricular Rate:  102 PR Interval:  134 QRS Duration: 84 QT Interval:  336 QTC Calculation: 437 R Axis:   68 Text Interpretation:  Sinus tachycardia Otherwise normal ECG No old tracing to compare Confirmed by [facility]  MD, ELLIOTT [phone]) on 02/11/2016 1:37:22 PM       Radiology No results found.  Procedures Procedures (including critical care time)  Medications Ordered in ED Medications - No data to display   Initial Impression / Assessment and Plan / ED Course  I have reviewed the triage vital signs and the nursing notes.  Pertinent labs & imaging results that were available during my care of the patient were reviewed by me and considered in my medical decision making (see chart for details).  Clinical Course  Pt has a persistant cough with production.      Final Clinical Impressions(s) / ED Diagnoses   Final diagnoses:  Chest wall pain  Bronchitis    New Prescriptions Discharge Medication List as of 02/11/2016  2:07 PM    START taking these medications   Details  azithromycin (ZITHROMAX) 250 MG tablet Take 1 tablet (250 mg total) by mouth daily. Take first 2 tablets together, then 1 every day until finished., Starting Tue 02/11/2016, Print     HYDROcodone-acetaminophen (NORCO/VICODIN) 5-325 MG tablet Take 2 tablets by mouth every 4 (four) hours as needed., Starting Tue 02/11/2016, Print         Hollace Kinnier [place], PA-C 02/22/16 [place], MD 02/22/16 [phone]

## 2016-05-18 ENCOUNTER — Ambulatory Visit (INDEPENDENT_AMBULATORY_CARE_PROVIDER_SITE_OTHER): Payer: 59 | Admitting: Neurology

## 2016-05-18 ENCOUNTER — Telehealth: Payer: Self-pay | Admitting: Neurology

## 2016-05-18 ENCOUNTER — Encounter: Payer: Self-pay | Admitting: Neurology

## 2016-05-18 ENCOUNTER — Other Ambulatory Visit: Payer: Self-pay | Admitting: *Deleted

## 2016-05-18 VITALS — BP 142/81 | HR 102 | Ht 67.0 in | Wt 188.0 lb

## 2016-05-18 DIAGNOSIS — R202 Paresthesia of skin: Secondary | ICD-10-CM | POA: Diagnosis not present

## 2016-05-18 DIAGNOSIS — R5383 Other fatigue: Secondary | ICD-10-CM

## 2016-05-18 DIAGNOSIS — G35 Multiple sclerosis: Secondary | ICD-10-CM

## 2016-05-18 MED ORDER — DULOXETINE HCL 60 MG PO CPEP: 60.0000 mg | ORAL_CAPSULE | Freq: Every day | ORAL | 11 refills | Status: DC

## 2016-05-18 NOTE — Telephone Encounter (Signed)
Colin Phillips.  his avs says his scripts are going to [facility].  please correct ** his correct pharmacy is [facility] in [place].

## 2016-05-18 NOTE — Telephone Encounter (Signed)
Local pharmacy is correct and his prescription has been sent to [facility].  [place] pharmacy is for specialty medications only.

## 2016-05-18 NOTE — Progress Notes (Signed)
Chief Complaint  Patient presents with  . Multiple Sclerosis    He is here with his girlfriend, Santiago Glad.  He stopped using his Avonex in April 2017.  He was tired of having 2-3 days of flu-like symptoms with each injection.  He has noticed the following symptoms:  increased fatigue, intermittent numbness/tingling throughout body and frequent urination.      [facility]  PATIENT: Colin Phillips DOB: 14-Sep-1975  HISTORICAL Mr. Ansley is a 40 years old right-handed Caucasian male, followup for relapsing remitting multiple sclerosis since 2007, he was a patient of Dr. Erling Cruz, I saw himInitially in Jan 2015.  He has long-standing history of chronic low back pain,  bipolar disease are treated by psychiatrist Dr. Candis Schatz.,   He presented with both hand paresthesia, feet numbness with neck flexion, Lhermitt sign. He was treated with Avonex for many years, but never compliant with his medications,   Most recent MRI was in April 2016,  MRI of brain, mild Arnold-Chiari malformation, no MS lesions MRI cervical spine, single posterior C4 lesions no contrast enhancement   He has stopped taking Avonex since April 2017, also stopped his polypharmacy for his bipolar disorder, risperidone, lamotrigine, Zoloft, "I am just tired of all the medications "  He complains of fatigue, no worsening multiple sclerosis signs, specific, no visual loss, no gait abnormality, has urinary urgency, but no incontinence, he continues to complains of bilateral hands paresthesia right worse than left   REVIEW OF SYSTEMS: Full 14 system review of systems performed and notable only for Frequent urination, numbness, tremor, joint pain, low back pain, anxious, frequent awakening, light sensitivity, fatigue  ALLERGIES: No Known Allergies  HOME MEDICATIONS: Outpatient Medications Prior to Visit  Medication Sig Dispense Refill  . ALPRAZolam (XANAX) 1 MG tablet Take 1 mg by mouth 3 (three) times daily  as needed. For anxiety    . diclofenac (VOLTAREN) 50 MG EC tablet Take 1 tablet (50 mg total) by mouth 2 (two) times daily. 20 tablet 0  . ibuprofen (ADVIL,MOTRIN) 200 MG tablet Take 200 mg by mouth every 6 (six) hours as needed.    . lamoTRIgine (LAMICTAL) 100 MG tablet Take by mouth daily.     . risperidone (RISPERDAL) 4 MG tablet Take 4 mg by mouth at bedtime.    Marland Kitchen azithromycin (ZITHROMAX) 250 MG tablet Take 1 tablet (250 mg total) by mouth daily. Take first 2 tablets together, then 1 every day until finished. 6 tablet 0  . azithromycin (ZITHROMAX) 250 MG tablet Take 1 tablet (250 mg total) by mouth daily. Take first 2 tablets together, then 1 every day until finished. 6 tablet 0  . diclofenac (CATAFLAM) 50 MG tablet Take 50 mg by mouth 2 (two) times daily.    Marland Kitchen gabapentin (NEURONTIN) 300 MG capsule Take 1 capsule (300 mg total) by mouth 3 (three) times daily. 90 capsule 11  . HYDROcodone-acetaminophen (NORCO/VICODIN) 5-325 MG tablet Take 2 tablets by mouth every 4 (four) hours as needed. 16 tablet 0  . Interferon Beta-1a (AVONEX PEN) 30 MCG/0.5ML AJKT Inject 30 mcg into the muscle every 7 (seven) days. 1 each 3  . zolpidem (AMBIEN) 10 MG tablet Take 10 mg by mouth at bedtime as needed for sleep.     No facility-administered medications prior to visit.     PAST MEDICAL HISTORY: Past Medical History:  Diagnosis Date  . Bipolar 1 disorder ([place])   . Bronchitis   . Chronic back pain   . Depression with  anxiety   . Multiple sclerosis ([place])   . Nausea     PAST SURGICAL HISTORY: Past Surgical History:  Procedure Laterality Date  . None      FAMILY HISTORY: Family History  Problem Relation Age of Onset  . Cancer Mother   . Cancer      grandfather  . Cancer Cousin     SOCIAL HISTORY:  Social History   Social History  . Marital status: Single    Spouse name: N/A  . Number of children: 0  . Years of education: 12   Occupational History  .      unemployed   Social  History Main Topics  . Smoking status: Current Every Day Smoker    Types: Cigars  . Smokeless tobacco: Never Used  . Alcohol use No  . Drug use: No  . Sexual activity: Not on file   Other Topics Concern  . Not on file   Social History Narrative   Patient is unemployed.    Patient lives at home with his father, niece and nephew.   Patient is right handed.   Patient drinks sodas mountain dews     PHYSICAL EXAM   Vitals:   05/18/16 1311  BP: (!) 142/81  Pulse: (!) 102  Weight: 188 lb (85.3 kg)  Height: 5\' 7"  (1.702 m)    Not recorded      Body mass index is 29.44 kg/m.  PHYSICAL EXAMNIATION:  Gen: NAD, conversant, well nourised, obese, well groomed                     Cardiovascular: Regular rate rhythm, no peripheral edema, warm, nontender. Eyes: Conjunctivae clear without exudates or hemorrhage Neck: Supple, no carotid bruits. Pulmonary: Clear to auscultation bilaterally   NEUROLOGICAL EXAM:  MENTAL STATUS: Speech:    Speech is normal; fluent and spontaneous with normal comprehension.  Cognition:     Orientation to time, place and person     Normal recent and remote memory     Normal Attention span and concentration     Normal Language, naming, repeating,spontaneous speech     Fund of knowledge   CRANIAL NERVES: CN II: Visual fields are full to confrontation. Fundoscopic exam is normal with sharp discs and no vascular changes. Pupils are round equal and briskly reactive to light. CN III, IV, VI: extraocular movement are normal. No ptosis. CN V: Facial sensation is intact to pinprick in all 3 divisions bilaterally. Corneal responses are intact.  CN VII: Face is symmetric with normal eye closure and smile. CN VIII: Hearing is normal to rubbing fingers CN IX, X: Palate elevates symmetrically. Phonation is normal. CN XI: Head turning and shoulder shrug are intact CN XII: Tongue is midline with normal movements and no atrophy.  MOTOR: There is no pronator  drift of out-stretched arms. Muscle bulk and tone are normal. Muscle strength is normal.  REFLEXES: Reflexes are 2+ and symmetric at the biceps, triceps, knees, and ankles. Plantar responses are flexor.  SENSORY: Intact to light touch, pinprick, positional and vibratory sensation are intact in fingers and toes.  COORDINATION: Rapid alternating movements and fine finger movements are intact. There is no dysmetria on finger-to-nose and heel-knee-shin.    GAIT/STANCE: Posture is normal. Gait is steady with normal steps, base, arm swing, and turning. Heel and toe walking are normal. Tandem gait is normal.  Romberg is absent.      DIAGNOSTIC DATA (LABS, IMAGING, TESTING) - I reviewed patient  records, labs, notes, testing and imaging myself where available.   ASSESSMENT AND PLAN   40 years old male with history of single isolated event, C4 posterior cord lesion, no MS lesions on MRI of the brain  He wants to hold off further imaging study because the high co-pay After discuss with patient, we decided to hold off any disease modifying medication at this point, Laboratory evaluations Call clinic for any new or recurrent symptoms Continue to work with his psychiatrist for mood control Return to clinic in one year  Marcial Pacas, M.D. Ph.D.  [facility] [address] [place], [place] [address]  [phone]

## 2016-05-19 ENCOUNTER — Telehealth: Payer: Self-pay | Admitting: Neurology

## 2016-05-19 ENCOUNTER — Encounter: Payer: Self-pay | Admitting: *Deleted

## 2016-05-19 LAB — CBC
Hematocrit: 45.7 % (ref 37.5–51.0)
Hemoglobin: 15.8 g/dL (ref 13.0–17.7)
MCH: 33.9 pg — ABNORMAL HIGH (ref 26.6–33.0)
MCHC: 34.6 g/dL (ref 31.5–35.7)
MCV: 98 fL — AB (ref 79–97)
PLATELETS: 226 10*3/uL (ref 150–379)
RBC: 4.66 x10E6/uL (ref 4.14–5.80)
RDW: 13.1 % (ref 12.3–15.4)
WBC: 8.1 10*3/uL (ref 3.4–10.8)

## 2016-05-19 LAB — RPR: RPR: NONREACTIVE

## 2016-05-19 LAB — COMPREHENSIVE METABOLIC PANEL
A/G RATIO: 1.7 (ref 1.2–2.2)
ALK PHOS: 89 IU/L (ref 39–117)
ALT: 18 IU/L (ref 0–44)
AST: 17 IU/L (ref 0–40)
Albumin: 4.3 g/dL (ref 3.5–5.5)
BILIRUBIN TOTAL: 0.3 mg/dL (ref 0.0–1.2)
BUN/Creatinine Ratio: 12 (ref 9–20)
BUN: 11 mg/dL (ref 6–24)
CHLORIDE: 102 mmol/L (ref 96–106)
CO2: 26 mmol/L (ref 18–29)
Calcium: 9.3 mg/dL (ref 8.7–10.2)
Creatinine, Ser: 0.94 mg/dL (ref 0.76–1.27)
GFR calc non Af Amer: 101 mL/min/{1.73_m2} (ref >59)
GFR, EST AFRICAN AMERICAN: 117 mL/min/{1.73_m2} (ref >59)
GLUCOSE: 100 mg/dL — AB (ref 65–99)
Globulin, Total: 2.5 g/dL (ref 1.5–4.5)
POTASSIUM: 4.1 mmol/L (ref 3.5–5.2)
Sodium: 142 mmol/L (ref 134–144)
TOTAL PROTEIN: 6.8 g/dL (ref 6.0–8.5)

## 2016-05-19 LAB — ANA W/REFLEX IF POSITIVE: ANA: NEGATIVE

## 2016-05-19 LAB — C-REACTIVE PROTEIN: CRP: 1.7 mg/L (ref 0.0–4.9)

## 2016-05-19 LAB — CK: CK TOTAL: 59 U/L (ref 24–204)

## 2016-05-19 LAB — TSH: TSH: 2.07 u[IU]/mL (ref 0.450–4.500)

## 2016-05-19 LAB — FOLATE: FOLATE: 2.9 ng/mL — AB (ref >3.0)

## 2016-05-19 LAB — VITAMIN B12: Vitamin B-12: 412 pg/mL (ref 232–1245)

## 2016-05-19 LAB — VITAMIN D 25 HYDROXY (VIT D DEFICIENCY, FRACTURES): Vit D, 25-Hydroxy: 7.9 ng/mL — ABNORMAL LOW (ref 30.0–100.0)

## 2016-05-19 NOTE — Telephone Encounter (Signed)
He is aware of results and will start the recommended supplement.

## 2016-05-19 NOTE — Telephone Encounter (Signed)
Please call him, lab showed significant low Vit D 7.9, he should take Vit D3 supplement 2000 units daily.

## 2017-05-18 ENCOUNTER — Ambulatory Visit: Payer: 59 | Admitting: Nurse Practitioner

## 2017-06-30 ENCOUNTER — Encounter (HOSPITAL_COMMUNITY): Payer: Self-pay

## 2017-06-30 ENCOUNTER — Emergency Department (HOSPITAL_COMMUNITY)
Admission: EM | Admit: 2017-06-30 | Discharge: 2017-06-30 | Disposition: A | Discharge status: Home / Self Care | Payer: 59 | Attending: Emergency Medicine | Admitting: Emergency Medicine

## 2017-06-30 DIAGNOSIS — F17290 Nicotine dependence, other tobacco product, uncomplicated: Secondary | ICD-10-CM | POA: Diagnosis not present

## 2017-06-30 DIAGNOSIS — K922 Gastrointestinal hemorrhage, unspecified: Secondary | ICD-10-CM | POA: Insufficient documentation

## 2017-06-30 DIAGNOSIS — Z79899 Other long term (current) drug therapy: Secondary | ICD-10-CM | POA: Diagnosis not present

## 2017-06-30 DIAGNOSIS — K625 Hemorrhage of anus and rectum: Secondary | ICD-10-CM | POA: Diagnosis present

## 2017-06-30 LAB — COMPREHENSIVE METABOLIC PANEL
ALBUMIN: 4.5 g/dL (ref 3.5–5.0)
ALK PHOS: 75 U/L (ref 38–126)
ALT: 47 U/L (ref 17–63)
AST: 38 U/L (ref 15–41)
Anion gap: 10 (ref 5–15)
BILIRUBIN TOTAL: 0.8 mg/dL (ref 0.3–1.2)
BUN: 13 mg/dL (ref 6–20)
CALCIUM: 9 mg/dL (ref 8.9–10.3)
CO2: 22 mmol/L (ref 22–32)
CREATININE: 0.95 mg/dL (ref 0.61–1.24)
Chloride: 105 mmol/L (ref 101–111)
GFR calc Af Amer: >60 mL/min (ref >60)
Glucose, Bld: 115 mg/dL — ABNORMAL HIGH (ref 65–99)
Potassium: 3.3 mmol/L — ABNORMAL LOW (ref 3.5–5.1)
Sodium: 137 mmol/L (ref 135–145)
TOTAL PROTEIN: 8 g/dL (ref 6.5–8.1)

## 2017-06-30 LAB — CBC WITH DIFFERENTIAL/PLATELET
Basophils Absolute: 0 10*3/uL (ref 0.0–0.1)
Basophils Relative: 0 %
Eosinophils Absolute: 0 10*3/uL (ref 0.0–0.7)
Eosinophils Relative: 0 %
HCT: 42.8 % (ref 39.0–52.0)
HEMOGLOBIN: 15.1 g/dL (ref 13.0–17.0)
LYMPHS ABS: 1.5 10*3/uL (ref 0.7–4.0)
LYMPHS PCT: 21 %
MCH: 34.1 pg — AB (ref 26.0–34.0)
MCHC: 35.3 g/dL (ref 30.0–36.0)
MCV: 96.6 fL (ref 78.0–100.0)
MONOS PCT: 4 %
Monocytes Absolute: 0.3 10*3/uL (ref 0.1–1.0)
NEUTROS PCT: 75 %
Neutro Abs: 5.4 10*3/uL (ref 1.7–7.7)
Platelets: 218 10*3/uL (ref 150–400)
RBC: 4.43 MIL/uL (ref 4.22–5.81)
RDW: 12.6 % (ref 11.5–15.5)
WBC: 7.1 10*3/uL (ref 4.0–10.5)

## 2017-06-30 LAB — PROTIME-INR
INR: 0.9
Prothrombin Time: 12.1 seconds (ref 11.4–15.2)

## 2017-06-30 NOTE — ED Triage Notes (Signed)
Pt reports reports 1 1/2 months ago he noticed "a lot" of blood in commode after a bm.  Reports since then he has noticed bright red blood on toilet paper.  Pt says he thinks he has a hemorrhoid.  Denies any n/v.

## 2017-06-30 NOTE — Discharge Instructions (Signed)
Your testing here was normal, you have normal blood counts, he would definitely benefit from decreasing the amount of alcohol that you take, you will need to see a GI doctor for further evaluation if you continue to have bleeding.  I have given you the phone number above for the local gastroenterologist who is on-call.  Please call today for the next available appointment.  Return to the emergency department for severe or worsening bleeding.

## 2017-06-30 NOTE — ED Provider Notes (Signed)
[facility] EMERGENCY DEPARTMENT Provider Note   CSN: [id number] Arrival date & time: 06/30/17  1350     History   Chief Complaint Chief Complaint  Patient presents with  . Rectal Bleeding    HPI Colin Phillips is a 42 y.o. male.  HPI  The patient is a 42 year old male, he has a known history of bipolar disorder, multiple sclerosis and anxiety, the only daily medication that he takes is Xanax however the patient does drink fairly heavily up to a 6 pack of beer a night and sometimes liquor as well.  He denies a history of drinking earlier in the day, he does no other drugs, he occasionally has been putting suppositories in his rectum because of what he thought was hemorrhoids.  6 weeks ago he developed a large amount of bright red blood per rectum in the toilet during a bowel movement, after that he only had a small amount of blood on the paper when he wiped and states that that has continued over the last 6 weeks.  He denies any abdominal pain lightheadedness shortness of breath swelling difficulty urinating and does not predispose to bleeding or easy bruising.  He has not been taking any oral medications but has been using Preparation H until he was seeing blood.  He has never seen a gastroenterologist, has never had a colonoscopy, states that his appetite is great and he is eating and drinking without difficulty.  He has no pain with bowel movements which she state are soft and brown and up to 5 times per day which she feels is abnormal.  Past Medical History:  Diagnosis Date  . Bipolar 1 disorder ([place])   . Bronchitis   . Chronic back pain   . Depression with anxiety   . Multiple sclerosis ([place])   . Nausea     Patient Active Problem List   Diagnosis Date Noted  . Fatigue 05/18/2016  . Paresthesia 05/18/2016  . Multiple sclerosis ([place])   . Bipolar 1 disorder ([place])   . Chronic back pain   . Depression with anxiety   . GERD 01/14/2007    Past Surgical History:    Procedure Laterality Date  . None         Home Medications    Prior to Admission medications   Medication Sig Start Date End Date Taking? Authorizing Provider  ALPRAZolam Duanne Moron) 1 MG tablet Take 1 mg by mouth 3 (three) times daily as needed. For anxiety   Yes [provider]  Cholecalciferol (VITAMIN D3) 2000 units TABS Take by mouth daily.   Yes [provider]  guaiFENesin (MUCINEX) 600 MG 12 hr tablet Take 600 mg by mouth 2 (two) times daily.   Yes [provider]  ibuprofen (ADVIL,MOTRIN) 200 MG tablet Take 200 mg by mouth every 6 (six) hours as needed.   Yes [provider]    Family History Family History  Problem Relation Age of Onset  . Cancer Mother   . Cancer Unknown        grandfather  . Cancer Cousin     Social History Social History   Tobacco Use  . Smoking status: Current Every Day Smoker    Types: Cigars  . Smokeless tobacco: Never Used  Substance Use Topics  . Alcohol use: Yes    Comment: 6pack per day  . Drug use: No     Allergies   Patient has no known allergies.   Review of Systems Review of  Systems  All other systems reviewed and are negative.    Physical Exam Updated Vital Signs BP 129/85 (BP Location: Left Arm)   Pulse (!) 113   Temp 98 F (36.7 C) (Oral)   Resp 16   Ht 5\' 7"  (1.702 m)   Wt 88.5 kg (195 lb)   SpO2 98%   BMI 30.54 kg/m   Physical Exam  Constitutional: He appears well-developed and well-nourished. No distress.  HENT:  Head: Normocephalic and atraumatic.  Mouth/Throat: Oropharynx is clear and moist. No oropharyngeal exudate.  Eyes: Conjunctivae and EOM are normal. Pupils are equal, round, and reactive to light. Right eye exhibits no discharge. Left eye exhibits no discharge. No scleral icterus.  Neck: Normal range of motion. Neck supple. No JVD present. No thyromegaly present.  Cardiovascular: Normal rate, regular rhythm, normal heart sounds and intact distal pulses. Exam  reveals no gallop and no friction rub.  No murmur heard. Pulmonary/Chest: Effort normal and breath sounds normal. No respiratory distress. He has no wheezes. He has no rales.  Abdominal: Soft. Bowel sounds are normal. He exhibits no distension and no mass. There is no tenderness.  Genitourinary:  Genitourinary Comments: On my exam the patient has a normal-appearing anus, rectum, no presence of hemorrhoids, no presence of fissures, no masses or palpable tenderness on the inside of the rectal exam, there is no stool in the rectal vault, small amount of mucus which is Hemoccult negative.  Prostate is palpated and normal-appearing  Musculoskeletal: Normal range of motion. He exhibits no edema or tenderness.  Lymphadenopathy:    He has no cervical adenopathy.  Neurological: He is alert. Coordination normal.  Skin: Skin is warm and dry. No rash noted. No erythema.  Psychiatric: He has a normal mood and affect. His behavior is normal.  Nursing note and vitals reviewed.    ED Treatments / Results  Labs (all labs ordered are listed, but only abnormal results are displayed) Labs Reviewed  CBC WITH DIFFERENTIAL/PLATELET - Abnormal; Notable for the following components:      Result Value   MCH 34.1 (*)    All other components within normal limits  COMPREHENSIVE METABOLIC PANEL - Abnormal; Notable for the following components:   Potassium 3.3 (*)    Glucose, Bld 115 (*)    All other components within normal limits  PROTIME-INR     Radiology No results found.  Procedures Procedures (including critical care time)  Medications Ordered in ED Medications - No data to display   Initial Impression / Assessment and Plan / ED Course  I have reviewed the triage vital signs and the nursing notes.  Pertinent labs & imaging results that were available during my care of the patient were reviewed by me and considered in my medical decision making (see chart for details).     Check hemoglobin,  likely needs referral to gastroenterology, no signs of external hemorrhoids, possibly has diverticulosis, he also has heavy alcohol use which could predispose him to some gastrointestinal bleeding.  I counseled the patient on the amount of alcohol that he drinks  CBC normal Pt informed Reassuring exam and history Can f/u with GI outpt Pt agreeable. Counseled on dec ETOH intake  Final Clinical Impressions(s) / ED Diagnoses   Final diagnoses:  Acute GI bleeding      Noemi Chapel, MD 06/30/17 1523

## 2017-08-11 IMAGING — DX DG RIBS W/ CHEST 3+V*L*
3 series · 3 of 3 positions shown · non-contrast
Comparison: February 12, 2006 chest x-ray

CLINICAL DATA: Pain after pushups.

EXAM:
LEFT RIBS AND CHEST - 3+ VIEW

[chest pa]
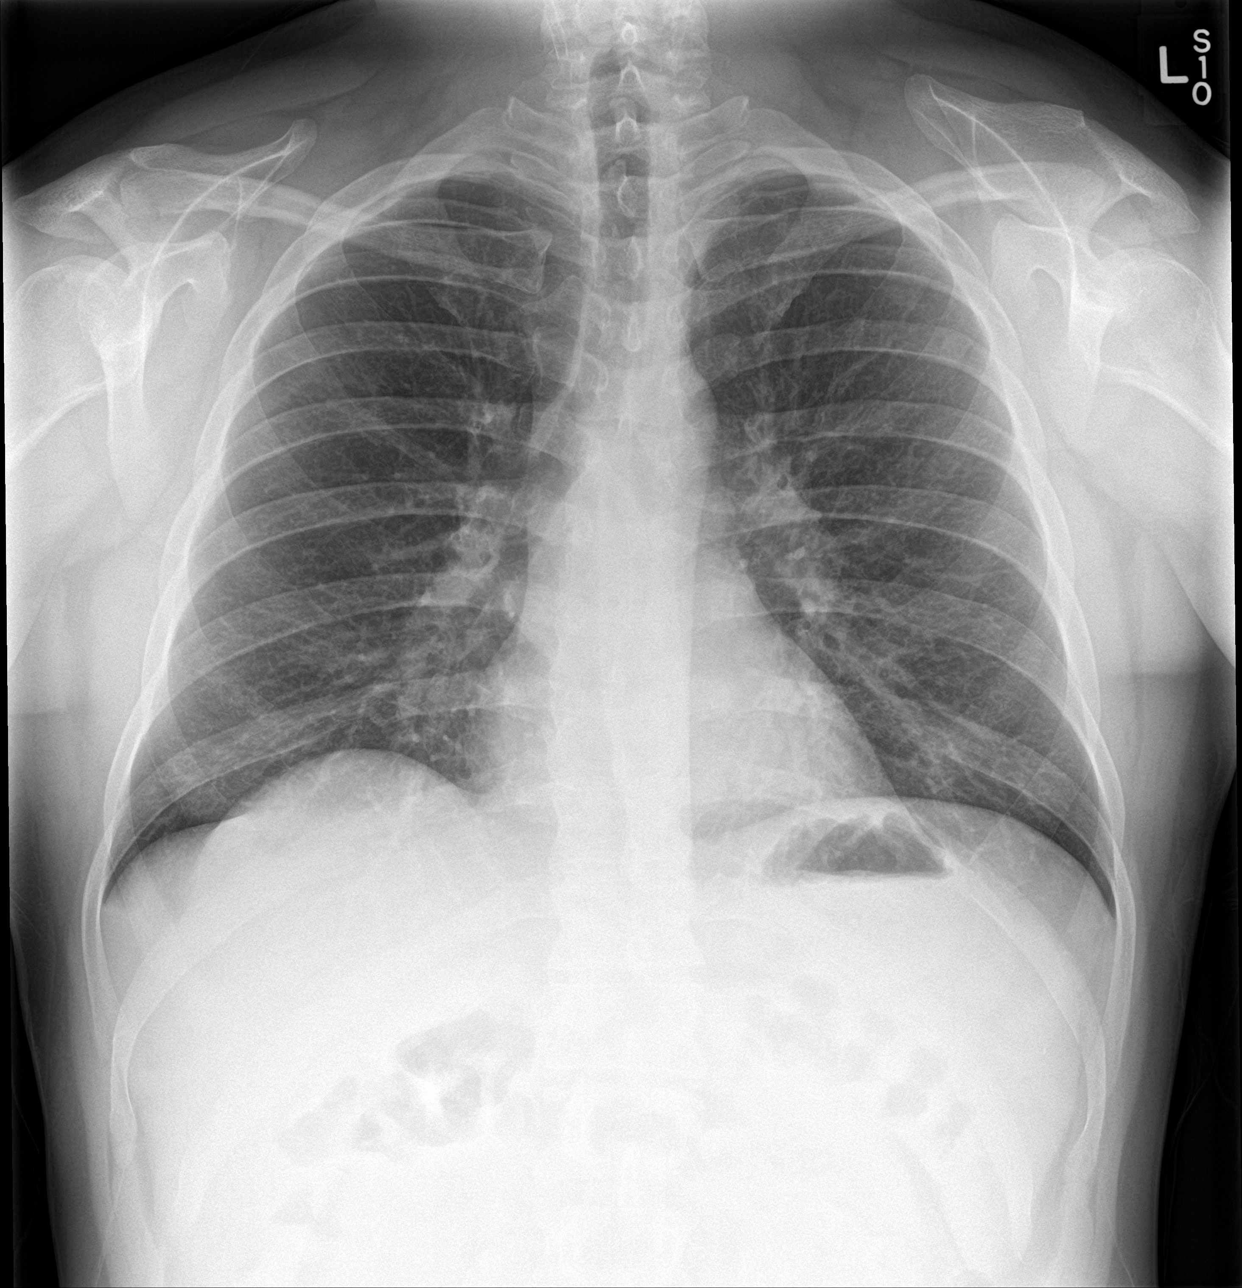

[rib pa obl (1 of 2)]
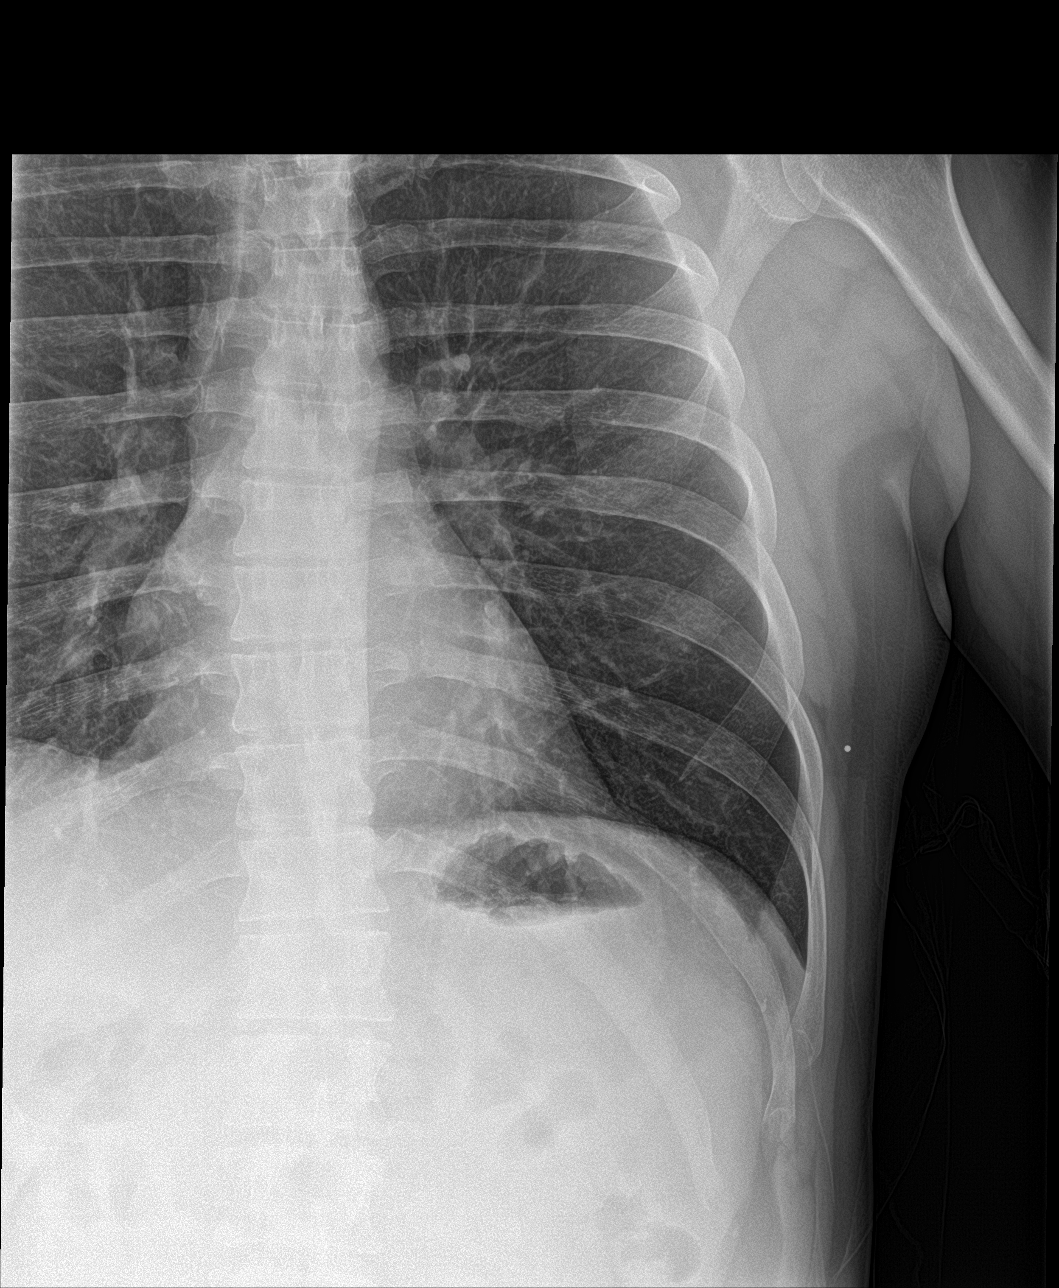

[rib pa obl (2 of 2)]
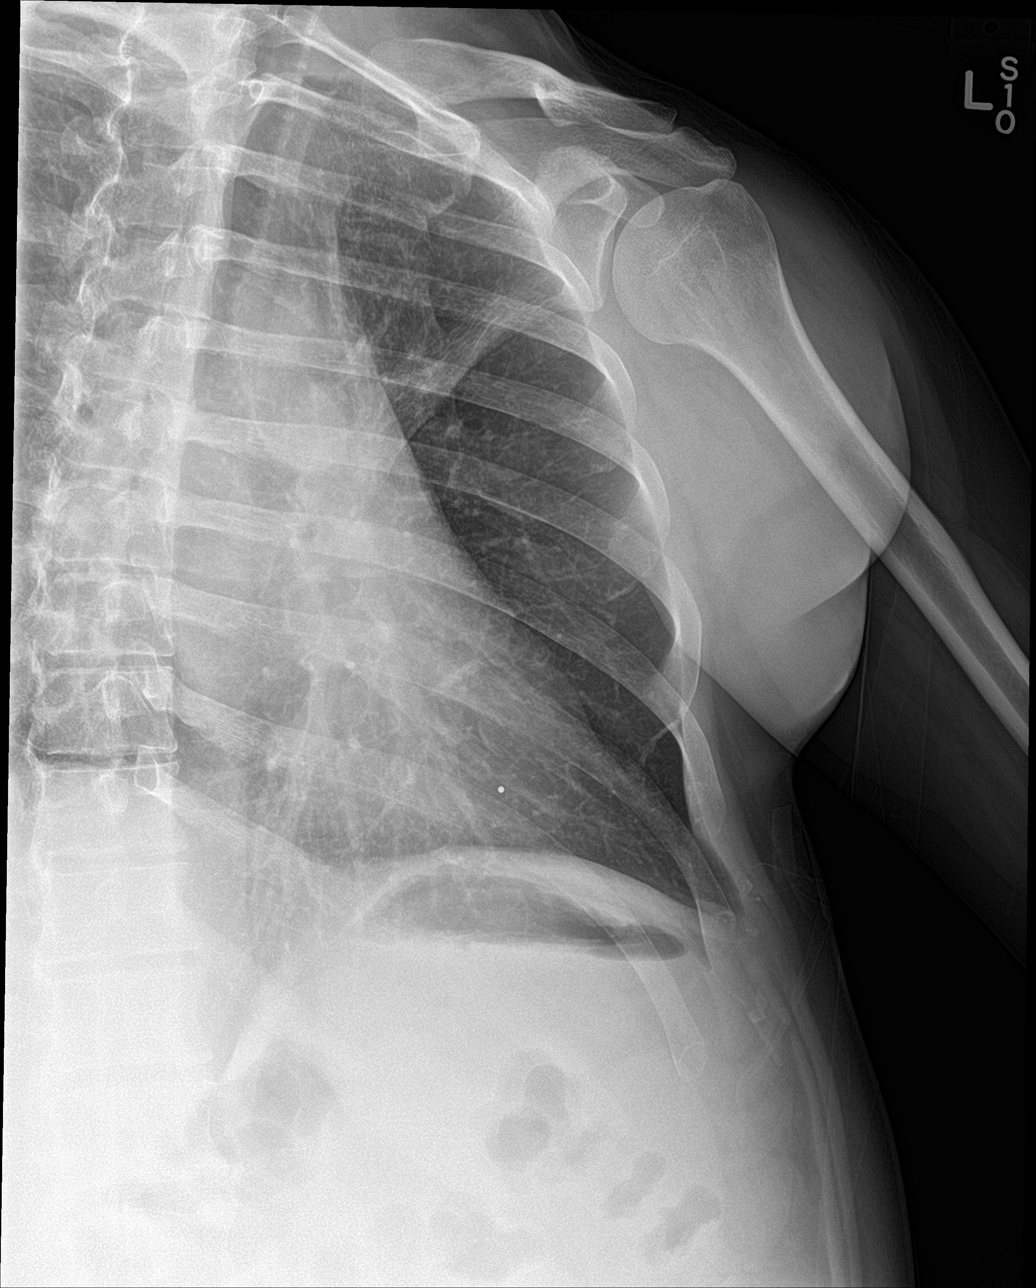

[3 of 3 positions shown; findings below may reference images not displayed]

FINDINGS: No fracture or other bone lesions are seen involving the ribs. There
is no evidence of pneumothorax or pleural effusion. Both lungs are
clear. Heart size and mediastinal contours are within normal limits.
IMPRESSION: Negative.

## 2018-08-18 ENCOUNTER — Telehealth: Payer: Self-pay | Admitting: Neurology

## 2018-08-18 NOTE — Telephone Encounter (Signed)
I spoke to the patient.  He is not on any disease modifying medications that would weaken his immune system.  He was instructed to follow the CDC guidelines in order to keep himself safe.  He has not been seen in our office since 05/2016.  He is aware that he needs to schedule an appt.  He declined to schedule today but will call back.  He needs to check with his new insurance company to make sure our practice is in his network.

## 2018-08-18 NOTE — Telephone Encounter (Signed)
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 He wants to know his precautions. He is requesting a call back.

## 2019-05-02 ENCOUNTER — Other Ambulatory Visit: Payer: Self-pay

## 2019-05-02 DIAGNOSIS — Z20822 Contact with and (suspected) exposure to covid-19: Secondary | ICD-10-CM

## 2019-05-03 ENCOUNTER — Ambulatory Visit: Payer: 59 | Admitting: Family Medicine

## 2019-05-04 ENCOUNTER — Telehealth: Payer: Self-pay | Admitting: *Deleted

## 2019-05-04 LAB — NOVEL CORONAVIRUS, NAA: SARS-CoV-2, NAA: NOT DETECTED

## 2019-05-04 NOTE — Telephone Encounter (Signed)
Called pt to alert him covid results negative. Pt was anxious about returning to work. Called and made aware.

## 2019-05-04 NOTE — Telephone Encounter (Signed)
Pt calling for Covid results; not resulted yet. Pt aware still active, pending.

## 2019-07-07 ENCOUNTER — Other Ambulatory Visit: Payer: Self-pay

## 2019-07-07 ENCOUNTER — Ambulatory Visit: Payer: 59 | Attending: Internal Medicine

## 2019-07-07 DIAGNOSIS — Z20822 Contact with and (suspected) exposure to covid-19: Secondary | ICD-10-CM | POA: Insufficient documentation

## 2019-07-09 LAB — NOVEL CORONAVIRUS, NAA: SARS-CoV-2, NAA: NOT DETECTED

## 2019-08-13 ENCOUNTER — Encounter (HOSPITAL_COMMUNITY): Payer: Self-pay | Admitting: Emergency Medicine

## 2019-08-13 ENCOUNTER — Other Ambulatory Visit: Payer: Self-pay

## 2019-08-13 ENCOUNTER — Emergency Department (HOSPITAL_COMMUNITY)
Admission: EM | Admit: 2019-08-13 | Discharge: 2019-08-13 | Disposition: A | Discharge status: Home / Self Care | Payer: 59 | Attending: Emergency Medicine | Admitting: Emergency Medicine

## 2019-08-13 DIAGNOSIS — K625 Hemorrhage of anus and rectum: Secondary | ICD-10-CM | POA: Diagnosis present

## 2019-08-13 DIAGNOSIS — F17200 Nicotine dependence, unspecified, uncomplicated: Secondary | ICD-10-CM | POA: Insufficient documentation

## 2019-08-13 LAB — CBC WITH DIFFERENTIAL/PLATELET
Abs Immature Granulocytes: 0.04 10*3/uL (ref 0.00–0.07)
Basophils Absolute: 0 10*3/uL (ref 0.0–0.1)
Basophils Relative: 0 %
Eosinophils Absolute: 0 10*3/uL (ref 0.0–0.5)
Eosinophils Relative: 0 %
HCT: 41.8 % (ref 39.0–52.0)
Hemoglobin: 14.6 g/dL (ref 13.0–17.0)
Immature Granulocytes: 0 %
Lymphocytes Relative: 17 %
Lymphs Abs: 1.6 10*3/uL (ref 0.7–4.0)
MCH: 34 pg (ref 26.0–34.0)
MCHC: 34.9 g/dL (ref 30.0–36.0)
MCV: 97.2 fL (ref 80.0–100.0)
Monocytes Absolute: 0.5 10*3/uL (ref 0.1–1.0)
Monocytes Relative: 6 %
Neutro Abs: 7.2 10*3/uL (ref 1.7–7.7)
Neutrophils Relative %: 77 %
Platelets: 214 10*3/uL (ref 150–400)
RBC: 4.3 MIL/uL (ref 4.22–5.81)
RDW: 12.4 % (ref 11.5–15.5)
WBC: 9.4 10*3/uL (ref 4.0–10.5)
nRBC: 0 % (ref 0.0–0.2)

## 2019-08-13 LAB — COMPREHENSIVE METABOLIC PANEL
ALT: 18 U/L (ref 0–44)
AST: 17 U/L (ref 15–41)
Albumin: 4.3 g/dL (ref 3.5–5.0)
Alkaline Phosphatase: 62 U/L (ref 38–126)
Anion gap: 7 (ref 5–15)
BUN: 13 mg/dL (ref 6–20)
CO2: 24 mmol/L (ref 22–32)
Calcium: 8.8 mg/dL — ABNORMAL LOW (ref 8.9–10.3)
Chloride: 106 mmol/L (ref 98–111)
Creatinine, Ser: 0.77 mg/dL (ref 0.61–1.24)
GFR calc Af Amer: >60 mL/min (ref >60)
GFR calc non Af Amer: >60 mL/min (ref >60)
Glucose, Bld: 102 mg/dL — ABNORMAL HIGH (ref 70–99)
Potassium: 3.5 mmol/L (ref 3.5–5.1)
Sodium: 137 mmol/L (ref 135–145)
Total Bilirubin: 0.6 mg/dL (ref 0.3–1.2)
Total Protein: 7.1 g/dL (ref 6.5–8.1)

## 2019-08-13 LAB — SAMPLE TO BLOOD BANK

## 2019-08-13 LAB — POC OCCULT BLOOD, ED: Fecal Occult Bld: POSITIVE — AB

## 2019-08-13 NOTE — ED Provider Notes (Signed)
[facility] Provider Note   CSN: [id number] Arrival date & time: 08/13/19  1424     History Chief Complaint  Patient presents with  . Blood In Stools    Colin Phillips is a 44 y.o. male who presents the emergency department with a chief complaint of rectal bleeding.  Patient states that he has noticed episodes in the past for the past 2 years.  He states that they seem to occur after he has been drinking alcohol.  He has 2-3 large glasses of wine about 2-3 times a week.  He states that he has never sought medical treatment due to social issues including lack of insurance however they have also been fleeting and usually he has noticed some blood after wiping his bottom.  For the past 2 days the patient has noticed bleeding every time he uses the bathroom including bright red blood on the tissue paper and filling the toilet bowl.  He denies passing of clots, abdominal pain, presyncope, weakness or shortness of breath.  He has no history of UC or Crohn's.  Does have a history of both internal and external hemorrhoids.  He states that his job requires heavy lifting.  HPI     Past Medical History:  Diagnosis Date  . Bipolar 1 disorder ([place])   . Bronchitis   . Chronic back pain   . Depression with anxiety   . Multiple sclerosis ([place])   . Nausea     Patient Active Problem List   Diagnosis Date Noted  . Fatigue 05/18/2016  . Paresthesia 05/18/2016  . Multiple sclerosis ([place])   . Bipolar 1 disorder ([place])   . Chronic back pain   . Depression with anxiety   . GERD 01/14/2007    Past Surgical History:  Procedure Laterality Date  . None         Family History  Problem Relation Age of Onset  . Cancer Mother   . Cancer Other        grandfather  . Cancer Cousin     Social History   Tobacco Use  . Smoking status: Current Every Day Smoker    Types: Cigars  . Smokeless tobacco: Never Used  Substance Use Topics  . Alcohol use: Yes    Comment:  6pack per day  . Drug use: No    Home Medications Prior to Admission medications   Medication Sig Start Date End Date Taking? Authorizing Provider  ALPRAZolam Duanne Moron) 1 MG tablet Take 1 mg by mouth 3 (three) times daily as needed. For anxiety    [provider]  Cholecalciferol (VITAMIN D3) 2000 units TABS Take by mouth daily.    [provider]  guaiFENesin (MUCINEX) 600 MG 12 hr tablet Take 600 mg by mouth 2 (two) times daily.    [provider]  ibuprofen (ADVIL,MOTRIN) 200 MG tablet Take 200 mg by mouth every 6 (six) hours as needed.    [provider]    Allergies    Patient has no known allergies.  Review of Systems   Review of Systems Ten systems reviewed and are negative for acute change, except as noted in the HPI.   Physical Exam Updated Vital Signs BP 119/70 (BP Location: Right Arm)   Pulse 91   Temp 98.7 F (37.1 C) (Oral)   Resp 16   Ht 5\' 6"  (1.676 m)   Wt 74.8 kg   SpO2 99%   BMI 26.63 kg/m   Physical Exam  Vitals and nursing note reviewed. Exam conducted with a chaperone present.  Constitutional:      General: He is not in acute distress.    Appearance: He is well-developed. He is not diaphoretic.  HENT:     Head: Normocephalic and atraumatic.  Eyes:     General: No scleral icterus.    Conjunctiva/sclera: Conjunctivae normal.  Cardiovascular:     Rate and Rhythm: Normal rate and regular rhythm.     Heart sounds: Normal heart sounds.  Pulmonary:     Effort: Pulmonary effort is normal. No respiratory distress.     Breath sounds: Normal breath sounds.  Abdominal:     Palpations: Abdomen is soft.     Tenderness: There is no abdominal tenderness.  Genitourinary:    Rectum: Guaiac result positive. External hemorrhoid (nonthrombosed) and internal hemorrhoid present. No tenderness. Normal anal tone.  Musculoskeletal:     Cervical back: Normal range of motion and neck supple.  Skin:    General: Skin is warm and dry.    Neurological:     Mental Status: He is alert.  Psychiatric:        Behavior: Behavior normal.     ED Results / Procedures / Treatments   Labs (all labs ordered are listed, but only abnormal results are displayed) Labs Reviewed  POC OCCULT BLOOD, ED - Abnormal; Notable for the following components:      Result Value   Fecal Occult Bld POSITIVE (*)    All other components within normal limits  CBC WITH DIFFERENTIAL/PLATELET  COMPREHENSIVE METABOLIC PANEL  SAMPLE TO BLOOD BANK    EKG None  Radiology No results found.  Procedures Procedures (including critical care time)  Medications Ordered in ED Medications - No data to display  ED Course  I have reviewed the triage vital signs and the nursing notes.  Pertinent labs & imaging results that were available during my care of the patient were reviewed by me and considered in my medical decision making (see chart for details).    MDM Rules/Calculators/A&P                      44 year old male with history of internal and external hemorrhoids here with rectal bleeding.  Has no abdominal tenderness on examination suggestive of colitis.  He has no diarrhea, fevers, chills.  Patient is afebrile and hemodynamically stable.  On exam patient did not have any obvious red blood or melena.  He does have some nonthrombosed external hemorrhoids and palpable internal hemorrhoids which is likely the cause of his bleeding.  Patient labs show no or abnormalities including no anemia.  He does have a slightly elevated blood glucose of insignificant value.  Patient is advised to follow with gastroenterology for further evaluation.  He appears otherwise appropriate for discharge at this time.  Final Clinical Impression(s) / ED Diagnoses Final diagnoses:  None    Rx / DC Orders ED Discharge Orders    None       Margarita Mail, PA-C 08/13/19 1636    Maudie Flakes, MD 08/16/19 2337

## 2019-08-13 NOTE — ED Triage Notes (Signed)
Blood in stool for last 5 days and just blood. History of blood in stool.

## 2019-08-13 NOTE — Discharge Instructions (Addendum)
Get help right away if:  You have new or increased rectal bleeding.  You have black or dark red stools.  You vomit blood or something that looks like coffee grounds.  You have pain or tenderness in your abdomen.  You have a fever.  You feel weak.  You feel nauseous.  You faint.  You have severe pain in your rectum.  You cannot have a bowel movement.

## 2019-08-14 ENCOUNTER — Encounter: Payer: Self-pay | Admitting: Physician Assistant

## 2019-08-23 ENCOUNTER — Other Ambulatory Visit: Payer: Self-pay

## 2019-08-23 ENCOUNTER — Encounter: Payer: Self-pay | Admitting: Physician Assistant

## 2019-08-23 ENCOUNTER — Ambulatory Visit: Payer: 59 | Admitting: Physician Assistant

## 2019-08-23 VITALS — BP 108/64 | HR 72 | Temp 97.9°F | Ht 67.0 in | Wt 177.0 lb

## 2019-08-23 DIAGNOSIS — K625 Hemorrhage of anus and rectum: Secondary | ICD-10-CM | POA: Diagnosis not present

## 2019-08-23 DIAGNOSIS — Z01818 Encounter for other preprocedural examination: Secondary | ICD-10-CM

## 2019-08-23 DIAGNOSIS — F1011 Alcohol abuse, in remission: Secondary | ICD-10-CM | POA: Diagnosis not present

## 2019-08-23 DIAGNOSIS — R194 Change in bowel habit: Secondary | ICD-10-CM | POA: Diagnosis not present

## 2019-08-23 MED ORDER — NA SULFATE-K SULFATE-MG SULF 17.5-3.13-1.6 GM/177ML PO SOLN: 1.0000 | Freq: Once | ORAL | 0 refills | Status: AC

## 2019-08-23 NOTE — Patient Instructions (Signed)
If you are age 45 or older, your body mass index should be between 23-30. Your Body mass index is 27.72 kg/m. If this is out of the aforementioned range listed, please consider follow up with your Primary Care Provider.  If you are age 70 or younger, your body mass index should be between 19-25. Your Body mass index is 27.72 kg/m. If this is out of the aformentioned range listed, please consider follow up with your Primary Care Provider.   \  You have been scheduled for an abdominal ultrasound at [facility] (1st floor of hospital) on 09/12/2019 at 8:00am. Please arrive 15 minutes prior to your appointment for registration. Make certain not to have anything to eat or drink after midnight prior to your appointment. Should you need to reschedule your appointment, please contact radiology at [phone]. This test typically takes about 30 minutes to perform.  You have been scheduled for an endoscopy and colonoscopy. Please follow the written instructions given to you at your visit today. Please pick up your prep supplies at the pharmacy within the next 1-3 days. If you use inhalers (even only as needed), please bring them with you on the day of your procedure.

## 2019-08-23 NOTE — Progress Notes (Signed)
Subjective:    Patient ID: Colin Phillips, male    DOB: 29-Apr-1976, 44 y.o.   MRN: [id number]  HPI Colin Phillips is a 44 year old white male, new to GI today referred after recent ER visit on 08/13/2019.  He is not had any prior GI evaluation. He has history of depression, bipolar disorder, and multiple sclerosis. He had presented to the emergency room with complaints of bright red blood mixed with his bowel movements for 2 days but had noted milder intermittent symptoms over the past couple of years.  On exam he was noted to have internal and external hemorrhoids and was heme positive. Labs showed hemoglobin of 14.6, MCV of 97, chemistries unremarkable. Patient says he first started having some symptoms in 2016 when he was drinking alcohol on a daily basis which caused him to have loose stools.  He was able to come off of alcohol for a period of time, then resumed in 2018 and had recurrence of loose stools and abdominal bloating.  He stopped alcohol for 6 to 8 months and then gradually went back to drinking. In the past he had noted bright red blood intermittently with the loose bowel movements after a night of drinking.  He never had any associated rectal pain or discomfort. With this most recent episode he says he saw more blood than he had ever seen in the past and had blood with every bowel movement daily for about a week.  He saw blood in the commode he thinks mixed with the stool and on the tissue.  He had been drinking alcohol regularly which he has stopped again over the past few weeks.  He had been drinking "3 tall glasses" of wine per night 2-3 times weekly.  He is a smoker.  He also mentions having intermittent significant problems with heartburn and indigestion especially with EtOH intake.  No complaints of dysphagia. He has not noticed any further blood over the past week. He is uncertain about family history, no regular aspirin or NSAID use  Review of Systems Pertinent positive  and negative review of systems were noted in the above HPI section.  All other review of systems was otherwise negative.  Outpatient Encounter Medications as of 08/23/2019  Medication Sig  . ALPRAZolam (XANAX) 1 MG tablet Take 1 mg by mouth 3 (three) times daily as needed. For anxiety  . ascorbic acid (VITAMIN C) 500 MG tablet Take 500 mg by mouth daily.  . Cholecalciferol (VITAMIN D3) 2000 units TABS Take 1 capsule by mouth daily.   . Na Sulfate-K Sulfate-Mg Sulf 17.5-3.13-1.6 GM/177ML SOLN Take 1 kit by mouth once for 1 dose.  . [DISCONTINUED] lamoTRIgine (LAMICTAL) 200 MG tablet Take 200 mg by mouth at bedtime.  . [DISCONTINUED] risperidone (RISPERDAL) 4 MG tablet Take 4 mg by mouth at bedtime.  . [DISCONTINUED] sertraline (ZOLOFT) 50 MG tablet Take 50 mg by mouth daily.   No facility-administered encounter medications on file as of 08/23/2019.   No Known Allergies Patient Active Problem List   Diagnosis Date Noted  . Fatigue 05/18/2016  . Paresthesia 05/18/2016  . Multiple sclerosis ([place])   . Bipolar 1 disorder ([place])   . Chronic back pain   . Depression with anxiety   . GERD 01/14/2007   Social History   Socioeconomic History  . Marital status: Single    Spouse name: Not on file  . Number of children: 0  . Years of education: 5  . Highest education level: Not on file  Occupational History    Comment: unemployed  Tobacco Use  . Smoking status: Current Every Day Smoker    Types: Cigars  . Smokeless tobacco: Never Used  Substance and Sexual Activity  . Alcohol use: Yes    Comment: 6pack per day  . Drug use: No  . Sexual activity: Not on file  Other Topics Concern  . Not on file  Social History Narrative   Patient is unemployed.    Patient lives at home with his father, niece and nephew.   Patient is right handed.   Patient drinks sodas mountain dews   Social Determinants of Health   Financial Resource Strain:   . Difficulty of Paying Living Expenses:   Food  Insecurity:   . Worried About Charity fundraiser in the Last Year:   . Arboriculturist in the Last Year:   Transportation Needs:   . Film/video editor (Medical):   Marland Kitchen Lack of Transportation (Non-Medical):   Physical Activity:   . Days of Exercise per Week:   . Minutes of Exercise per Session:   Stress:   . Feeling of Stress :   Social Connections:   . Frequency of Communication with Friends and Family:   . Frequency of Social Gatherings with Friends and Family:   . Attends Religious Services:   . Active Member of Clubs or Organizations:   . Attends Archivist Meetings:   Marland Kitchen Marital Status:   Intimate Partner Violence:   . Fear of Current or Ex-Partner:   . Emotionally Abused:   Marland Kitchen Physically Abused:   . Sexually Abused:     Colin Phillips family history includes Cancer in his cousin, mother, and another family member.      Objective:    Vitals:   08/23/19 0938  BP: 108/64  Pulse: 72  Temp: 97.9 F (36.6 C)    Physical Exam Well-developed well-nourished white male in no acute distress.  Height, Weight, 177 BMI 27.7  HEENT; nontraumatic normocephalic, EOMI, PER R LA, sclera anicteric. Oropharynx; not examined Neck; supple, no JVD Cardiovascular; regular rate and rhythm with S1-S2, no murmur rub or gallop Pulmonary; Clear bilaterally Abdomen; soft, nontender, nondistended, no palpable mass or hepatosplenomegaly, bowel sounds are active Rectal; not done today Skin; benign exam, no jaundice rash or appreciable lesions Extremities; no clubbing cyanosis or edema skin warm and dry Neuro/Psych; alert and oriented x4, grossly nonfocal mood and affect appropriate       Assessment & Plan:   #1 44 year old white male with 2 to 3-year history of intermittent low-grade hematochezia generally associated with significant EtOH intake resulting in looser stools. Patient with recent episode of rectal bleeding with increased volume and occurring daily over about a  week.  Blood noted in the stool, in the commode and on the tissue. Again this was associated with fairly heavy EtOH intake which he has since stopped.  Exam per ER MD with internal and external hemorrhoids heme positive stool, no anemia Bleeding may be secondary to internal hemorrhoids, rule out occult colon lesion  #2 intermittent GERD, smoker-rule out Barrett's  #3 bipolar disorder/depression #4 multiple sclerosis  Plan; Long discussion regarding EtOH overuse and importance of maintaining abstinence. Check upper abdominal ultrasound-rule out cirrhosis Patient will be scheduled for EGD and colonoscopy with Dr. Loletha Carrow.  Procedures were discussed in detail with the patient including indications risks and benefits and he is agreeable to proceed.  Amy Genia Harold PA-C 08/23/2019   Cc: Glennon Mac,  Hulen Shouts, PA*

## 2019-08-25 NOTE — Progress Notes (Signed)
____________________________________________________________  Attending physician addendum:  Thank you for sending this case to me. I have reviewed the entire note, and the outlined plan seems appropriate.  Yannet Rincon Danis, MD  ____________________________________________________________  

## 2019-09-12 ENCOUNTER — Ambulatory Visit (HOSPITAL_COMMUNITY): Payer: 59

## 2019-09-18 ENCOUNTER — Other Ambulatory Visit: Payer: Self-pay

## 2019-09-18 ENCOUNTER — Ambulatory Visit
Admission: EM | Admit: 2019-09-18 | Discharge: 2019-09-18 | Disposition: A | Discharge status: Home / Self Care | Payer: 59

## 2019-09-18 DIAGNOSIS — R1084 Generalized abdominal pain: Secondary | ICD-10-CM | POA: Diagnosis not present

## 2019-09-18 DIAGNOSIS — F419 Anxiety disorder, unspecified: Secondary | ICD-10-CM

## 2019-09-18 DIAGNOSIS — R140 Abdominal distension (gaseous): Secondary | ICD-10-CM

## 2019-09-18 NOTE — ED Triage Notes (Signed)
Pt presents with abdominal bloating and pain  , pt is scheduled for ultrasound and colonoscopy soon, pt also has h/o of MS and states he is mainly here because of being unable to sleep but is fatigue. Pt encouraged to go to ED for higher level of care but doesn't want to go to hospital

## 2019-09-18 NOTE — Discharge Instructions (Addendum)
Was advised to follow-up with GI Continue to take Xanax as prescribed and as directed for anxiety Increase fluid intake and stop alcohol drinking May OTC maalox  Go to ED if you develop worsening of symptoms.

## 2019-09-18 NOTE — ED Provider Notes (Addendum)
[facility]    CSN: [id number] Arrival date & time: 09/18/19  [id number]      History   Chief Complaint Chief Complaint  Patient presents with  . Abdominal Pain    HPI Colin Phillips is a 44 y.o. male.   With history of anxiety and on Xanax, and consume beer and wine daily  presented to the urgent care with a complaint of abdominal pain, bloating and stress with difficulty sleeping for the past few days.  Reports all his symptom is currently resolved.  Localized the pain to generalized abdomen.  Describes it as feeling bloated.  Has not tried any OTC medications.  Symptoms are made worse with drinking. Reports similar symptoms in the past.  Has not been on work for the past few days and would like to get a work note.  Denies fever, chills, appetite change, weight change, chest pain, nausea, vomiting, changes in bowel or bladder habits.LBM: 09/18/2019 and it is soft. Has colonoscopy ultrasound scheduled with GI on 4/28  The history is provided by the patient. No language interpreter was used.  Abdominal Pain   Past Medical History:  Diagnosis Date  . Bipolar 1 disorder ([place])   . Bronchitis   . Chronic back pain   . Depression with anxiety   . Multiple sclerosis ([place])   . Nausea     Patient Active Problem List   Diagnosis Date Noted  . Fatigue 05/18/2016  . Paresthesia 05/18/2016  . Multiple sclerosis ([place])   . Bipolar 1 disorder ([place])   . Chronic back pain   . Depression with anxiety   . GERD 01/14/2007    Past Surgical History:  Procedure Laterality Date  . None         Home Medications    Prior to Admission medications   Medication Sig Start Date End Date Taking? Authorizing Provider  ALPRAZolam Duanne Moron) 1 MG tablet Take 1 mg by mouth 3 (three) times daily as needed. For anxiety    [provider]  ascorbic acid (VITAMIN C) 500 MG tablet Take 500 mg by mouth daily.    [provider]  Cholecalciferol (VITAMIN D3) 2000 units  TABS Take 1 capsule by mouth daily.     [provider]    Family History Family History  Problem Relation Age of Onset  . Cancer Mother   . Cancer Other        grandfather  . Cancer Cousin     Social History Social History   Tobacco Use  . Smoking status: Current Every Day Smoker    Types: Cigars  . Smokeless tobacco: Never Used  Substance Use Topics  . Alcohol use: Yes    Comment: 6pack per day  . Drug use: No     Allergies   Patient has no known allergies.   Review of Systems Review of Systems  Constitutional: Negative.   Respiratory: Negative.   Cardiovascular: Negative.   Gastrointestinal: Positive for abdominal pain.       Bloating  Psychiatric/Behavioral: The patient is nervous/anxious.   All other systems reviewed and are negative.    Physical Exam Triage Vital Signs ED Triage Vitals [09/18/19 1014]  Enc Vitals Group     BP 139/81     Pulse Rate (!) 103     Resp 18     Temp 98 F (36.7 C)     Temp src      SpO2 97 %     Weight  Height      Head Circumference      Peak Flow      Pain Score      Pain Loc      Pain Edu?      Excl. in [place]?    No data found.  Updated Vital Signs BP 139/81   Pulse (!) 103   Temp 98 F (36.7 C)   Resp 18   SpO2 97%   Visual Acuity Right Eye Distance:   Left Eye Distance:   Bilateral Distance:    Right Eye Near:   Left Eye Near:    Bilateral Near:     Physical Exam Vitals and nursing note reviewed.  Constitutional:      General: He is not in acute distress.    Appearance: Normal appearance. He is well-developed and normal weight. He is not ill-appearing, toxic-appearing or diaphoretic.  Cardiovascular:     Rate and Rhythm: Regular rhythm. Tachycardia present.     Pulses: Normal pulses.     Heart sounds: Normal heart sounds. No murmur. No gallop.   Pulmonary:     Effort: Pulmonary effort is normal. No respiratory distress.     Breath sounds: Normal breath sounds. No stridor. No  wheezing, rhonchi or rales.  Chest:     Chest wall: No tenderness.  Abdominal:     General: Abdomen is flat. Bowel sounds are normal. There is no distension.     Palpations: Abdomen is soft. There is no mass.     Tenderness: There is no abdominal tenderness. There is no right CVA tenderness, left CVA tenderness, guarding or rebound.     Hernia: No hernia is present.  Neurological:     Mental Status: He is alert and oriented to person, place, and time.  Psychiatric:        Attention and Perception: Attention normal.        Mood and Affect: Mood is anxious.        Speech: Speech normal.        Behavior: Behavior normal.        Judgment: Judgment normal.      UC Treatments / Results  Labs (all labs ordered are listed, but only abnormal results are displayed) Labs Reviewed - No data to display  EKG   Radiology No results found.  Procedures Procedures (including critical care time)  Medications Ordered in UC Medications - No data to display  Initial Impression / Assessment and Plan / UC Course  I have reviewed the triage vital signs and the nursing notes.  Pertinent labs & imaging results that were available during my care of the patient were reviewed by me and considered in my medical decision making (see chart for details).    Patient is is stable for discharge.  Was advised to follow-up with GI for further evaluation.  To go to ED for worsening of symptoms.  Final Clinical Impressions(s) / UC Diagnoses   Final diagnoses:  Generalized abdominal pain  Bloating  Anxiety     Discharge Instructions     Was advised to follow-up with GI Continue to take Xanax as prescribed and as directed for anxiety Increase fluid intake May OTC maalox  Go to ED if you develop worsening of symptoms.    ED Prescriptions    None     PDMP not reviewed this encounter.   Emerson Monte, FNP 09/18/19 1115    Emerson Monte, FNP 09/18/19 1118

## 2019-09-25 ENCOUNTER — Other Ambulatory Visit: Payer: Self-pay | Admitting: Gastroenterology

## 2019-09-25 ENCOUNTER — Ambulatory Visit (INDEPENDENT_AMBULATORY_CARE_PROVIDER_SITE_OTHER): Payer: 59

## 2019-09-25 ENCOUNTER — Other Ambulatory Visit: Payer: Self-pay

## 2019-09-25 DIAGNOSIS — Z1159 Encounter for screening for other viral diseases: Secondary | ICD-10-CM

## 2019-09-25 LAB — SARS CORONAVIRUS 2 (TAT 6-24 HRS): SARS Coronavirus 2: NEGATIVE

## 2019-09-27 ENCOUNTER — Ambulatory Visit (AMBULATORY_SURGERY_CENTER): Payer: 59 | Admitting: Gastroenterology

## 2019-09-27 ENCOUNTER — Other Ambulatory Visit: Payer: Self-pay

## 2019-09-27 ENCOUNTER — Encounter: Payer: Self-pay | Admitting: Gastroenterology

## 2019-09-27 VITALS — BP 129/82 | HR 82 | Temp 97.1°F | Resp 22 | Ht 67.0 in | Wt 177.0 lb

## 2019-09-27 DIAGNOSIS — D129 Benign neoplasm of anus and anal canal: Secondary | ICD-10-CM

## 2019-09-27 DIAGNOSIS — D128 Benign neoplasm of rectum: Secondary | ICD-10-CM

## 2019-09-27 DIAGNOSIS — K625 Hemorrhage of anus and rectum: Secondary | ICD-10-CM

## 2019-09-27 DIAGNOSIS — K621 Rectal polyp: Secondary | ICD-10-CM

## 2019-09-27 DIAGNOSIS — K648 Other hemorrhoids: Secondary | ICD-10-CM | POA: Diagnosis not present

## 2019-09-27 DIAGNOSIS — R12 Heartburn: Secondary | ICD-10-CM | POA: Diagnosis not present

## 2019-09-27 DIAGNOSIS — R194 Change in bowel habit: Secondary | ICD-10-CM

## 2019-09-27 HISTORY — PX: UPPER GASTROINTESTINAL ENDOSCOPY: SHX188

## 2019-09-27 HISTORY — PX: COLONOSCOPY: SHX174

## 2019-09-27 MED ORDER — SODIUM CHLORIDE 0.9 % IV SOLN: 500.0000 mL | Freq: Once | INTRAVENOUS | Status: DC

## 2019-09-27 NOTE — Patient Instructions (Signed)
Discharge instructions given. Handouts on polyps and hemorrhoids. Resume previous medications. YOU HAD AN ENDOSCOPIC PROCEDURE TODAY AT THE [facility]:   Refer to the procedure report that was given to you for any specific questions about what was found during the examination.  If the procedure report does not answer your questions, please call your gastroenterologist to clarify.  If you requested that your care partner not be given the details of your procedure findings, then the procedure report has been included in a sealed envelope for you to review at your convenience later.  YOU SHOULD EXPECT: Some feelings of bloating in the abdomen. Passage of more gas than usual.  Walking can help get rid of the air that was put into your GI tract during the procedure and reduce the bloating. If you had a lower endoscopy (such as a colonoscopy or flexible sigmoidoscopy) you may notice spotting of blood in your stool or on the toilet paper. If you underwent a bowel prep for your procedure, you may not have a normal bowel movement for a few days.  Please Note:  You might notice some irritation and congestion in your nose or some drainage.  This is from the oxygen used during your procedure.  There is no need for concern and it should clear up in a day or so.  SYMPTOMS TO REPORT IMMEDIATELY:  Following lower endoscopy (colonoscopy or flexible sigmoidoscopy):  Excessive amounts of blood in the stool  Significant tenderness or worsening of abdominal pains  Swelling of the abdomen that is new, acute  Fever of 100F or higher  Following upper endoscopy (EGD)  Vomiting of blood or coffee ground material  New chest pain or pain under the shoulder blades  Painful or persistently difficult swallowing  New shortness of breath  Fever of 100F or higher  Black, tarry-looking stools  For urgent or emergent issues, a gastroenterologist can be reached at any hour by calling [phone]. Do not use  MyChart messaging for urgent concerns.    DIET:  We do recommend a small meal at first, but then you may proceed to your regular diet.  Drink plenty of fluids but you should avoid alcoholic beverages for 24 hours.  ACTIVITY:  You should plan to take it easy for the rest of today and you should NOT DRIVE or use heavy machinery until tomorrow (because of the sedation medicines used during the test).    FOLLOW UP: Our staff will call the number listed on your records 48-72 hours following your procedure to check on you and address any questions or concerns that you may have regarding the information given to you following your procedure. If we do not reach you, we will leave a message.  We will attempt to reach you two times.  During this call, we will ask if you have developed any symptoms of COVID 19. If you develop any symptoms (ie: fever, flu-like symptoms, shortness of breath, cough etc.) before then, please call [phone].  If you test positive for Covid 19 in the 2 weeks post procedure, please call and report this information to us.    If any biopsies were taken you will be contacted by phone or by letter within the next 1-3 weeks.  Please call us at [phone] if you have not heard about the biopsies in 3 weeks.    SIGNATURES/CONFIDENTIALITY: You and/or your care partner have signed paperwork which will be entered into your electronic medical record.  These signatures attest   signatures attest to the fact that that the information above on your After Visit Summary has been reviewed and is understood.  Full responsibility of the confidentiality of this discharge information lies with you and/or your care-partner. 

## 2019-09-27 NOTE — Progress Notes (Signed)
Temp check by:JB Vital check by:DT  The medical and surgical history was reviewed and verified with the patient. 

## 2019-09-27 NOTE — Op Note (Signed)
[facility] Patient Name: Colin Phillips Procedure Date: 09/27/2019 10:56 AM MRN: [id number] Endoscopist: [place]. Loletha Carrow , MD Age: 44 Referring MD:  Date of Birth: 28-Oct-1975 Gender: Male Account #: [id number] Procedure:                Upper GI endoscopy Indications:              Heartburn (smoker) Medicines:                Monitored Anesthesia Care Procedure:                Pre-Anesthesia Assessment:                           - Prior to the procedure, a History and Physical                            was performed, and patient medications and                            allergies were reviewed. The patient's tolerance of                            previous anesthesia was also reviewed. The risks                            and benefits of the procedure and the sedation                            options and risks were discussed with the patient.                            All questions were answered, and informed consent                            was obtained. Prior Anticoagulants: The patient has                            taken no previous anticoagulant or antiplatelet                            agents. ASA Grade Assessment: II - A patient with                            mild systemic disease. After reviewing the risks                            and benefits, the patient was deemed in                            satisfactory condition to undergo the procedure.                           After obtaining informed consent, the endoscope was  passed under direct vision. Throughout the                            procedure, the patient's blood pressure, pulse, and                            oxygen saturations were monitored continuously. The                            Endoscope was introduced through the mouth, and                            advanced to the second part of duodenum. The upper                            GI endoscopy was accomplished  without difficulty.                            The patient tolerated the procedure well. Scope In: Scope Out: Findings:                 The esophagus was normal.                           The stomach was normal.                           The cardia and gastric fundus were normal on                            retroflexion.                           The examined duodenum was normal. Complications:            No immediate complications. Estimated Blood Loss:     Estimated blood loss: none. Impression:               - Normal larynx.                           - Normal esophagus.                           - Normal stomach.                           - Normal examined duodenum.                           - No specimens collected. Recommendation:           - Patient has a contact number available for                            emergencies. The signs and symptoms of potential  delayed complications were discussed with the                            patient. Return to normal activities tomorrow.                            Written discharge instructions were provided to the                            patient.                           - Resume previous diet.                           - Continue present medications.                           - Discontinue the use of any products containing                            nicotine.                           - Follow an antireflux regimen indefinitely. Fahmida Jurich L. Loletha Carrow, MD 09/27/2019 11:40:54 AM This report has been signed electronically.

## 2019-09-27 NOTE — Op Note (Signed)
[place] Patient Name: Colin Phillips Procedure Date: 09/27/2019 10:55 AM MRN: [id number] Endoscopist: [place]. Loletha Carrow , MD Age: 44 Referring MD:  Date of Birth: 1975/08/31 Gender: Male Account #: [id number] Procedure:                Colonoscopy Indications:              Rectal bleeding (patient reports typically occurs                            when consuming alcohol. Since recent ED visit and                            office visit, has only been scant occasional blood                            on paper) Medicines:                Monitored Anesthesia Care Procedure:                Pre-Anesthesia Assessment:                           - Prior to the procedure, a History and Physical                            was performed, and patient medications and                            allergies were reviewed. The patient's tolerance of                            previous anesthesia was also reviewed. The risks                            and benefits of the procedure and the sedation                            options and risks were discussed with the patient.                            All questions were answered, and informed consent                            was obtained. Prior Anticoagulants: The patient has                            taken no previous anticoagulant or antiplatelet                            agents. ASA Grade Assessment: II - A patient with                            mild systemic disease. After reviewing the risks  and benefits, the patient was deemed in                            satisfactory condition to undergo the procedure.                           After obtaining informed consent, the colonoscope                            was passed under direct vision. Throughout the                            procedure, the patient's blood pressure, pulse, and                            oxygen saturations were monitored continuously.  The                            Colonoscope was introduced through the anus and                            advanced to the the terminal ileum, with                            identification of the appendiceal orifice and IC                            valve. The colonoscopy was performed without                            difficulty. The patient tolerated the procedure                            well. The quality of the bowel preparation was                            good. The terminal ileum, ileocecal valve,                            appendiceal orifice, and rectum were photographed.                            The bowel preparation used was Miralax. Scope In: 11:20:09 AM Scope Out: 11:37:22 AM Scope Withdrawal Time: 0 hours 14 minutes 58 seconds  Total Procedure Duration: 0 hours 17 minutes 13 seconds  Findings:                 The perianal and digital rectal examinations were                            normal.                           The terminal ileum appeared normal.  Several patchy areas of mildly erythematous mucosa                            were found in the sigmoid colon.                           Two sessile polyps were found in the rectum. The                            polyps were 4 to 8 mm in size. One polyp was                            removed with a hot snare, and the other removed                            with a cold snare. Resection and retrieval were                            complete.                           Internal hemorrhoids were found. The hemorrhoids                            were small.                           The exam was otherwise without abnormality on                            direct and retroflexion views. Complications:            No immediate complications. Estimated Blood Loss:     Estimated blood loss was minimal. Impression:               - The examined portion of the ileum was normal.                           -  Erythematous mucosa in the sigmoid colon. No                            apparent clinical significance.                           - Two 4 to 8 mm polyps in the rectum, removed with                            a hot snare. Resected and retrieved.                           - Internal hemorrhoids.                           - The examination was otherwise normal on direct  and retroflexion views. Recommendation:           - Patient has a contact number available for                            emergencies. The signs and symptoms of potential                            delayed complications were discussed with the                            patient. Return to normal activities tomorrow.                            Written discharge instructions were provided to the                            patient.                           - Resume previous diet.                           - Continue present medications.                           - Await pathology results.                           - Repeat colonoscopy is recommended for                            surveillance. The colonoscopy date will be                            determined after pathology results from today's                            exam become available for review.                           - Return to my office PRN. If hemorrhoidal bleeding                            occurs frequently despite high fiber diet, bowel                            regularity and avoidance of excess alcohol, banding                            could be performed. Sotirios Navarro L. Loletha Carrow, MD 09/27/2019 11:47:10 AM This report has been signed electronically. Delphina Cahill, MD

## 2019-09-27 NOTE — Progress Notes (Signed)
Report given to PACU, vss 

## 2019-09-27 NOTE — Progress Notes (Signed)
Called to room to assist during endoscopic procedure.  Patient ID and intended procedure confirmed with present staff. Received instructions for my participation in the procedure from the performing physician.  

## 2019-09-29 ENCOUNTER — Telehealth: Payer: Self-pay

## 2019-09-29 NOTE — Telephone Encounter (Signed)
  Follow up Call-  Call back number 09/27/2019  Post procedure Call Back phone  # [phone]  Permission to leave phone message Yes  Some recent data might be hidden     Patient questions:  Do you have a fever, pain , or abdominal swelling? No. Pain Score  0 *  Have you tolerated food without any problems? Yes.    Have you been able to return to your normal activities? Yes.    Do you have any questions about your discharge instructions: Diet   No. Medications  No. Follow up visit  No.  Do you have questions or concerns about your Care? No.  Actions: * If pain score is 4 or above: No action needed, pain <4.  1. Have you developed a fever since your procedure? no  2.   Have you had an respiratory symptoms (SOB or cough) since your procedure? no  3.   Have you tested positive for COVID 19 since your procedure no  4.   Have you had any family members/close contacts diagnosed with the COVID 19 since your procedure?  no   If yes to any of these questions please route to Joylene John, RN and Erenest Rasher, RN

## 2019-10-02 ENCOUNTER — Encounter: Payer: Self-pay | Admitting: Gastroenterology

## 2019-10-25 ENCOUNTER — Ambulatory Visit (INDEPENDENT_AMBULATORY_CARE_PROVIDER_SITE_OTHER): Payer: No Typology Code available for payment source | Admitting: Gastroenterology

## 2019-11-20 ENCOUNTER — Institutional Professional Consult (permissible substitution): Payer: Self-pay | Admitting: Neurology

## 2020-02-11 ENCOUNTER — Other Ambulatory Visit: Payer: Self-pay

## 2020-02-11 ENCOUNTER — Encounter: Payer: Self-pay | Admitting: Emergency Medicine

## 2020-02-11 ENCOUNTER — Ambulatory Visit
Admission: EM | Admit: 2020-02-11 | Discharge: 2020-02-11 | Disposition: A | Discharge status: Home / Self Care | Payer: 59 | Attending: Emergency Medicine | Admitting: Emergency Medicine

## 2020-02-11 DIAGNOSIS — J069 Acute upper respiratory infection, unspecified: Secondary | ICD-10-CM | POA: Diagnosis not present

## 2020-02-11 DIAGNOSIS — Z1152 Encounter for screening for COVID-19: Secondary | ICD-10-CM | POA: Diagnosis not present

## 2020-02-11 MED ORDER — BENZONATATE 100 MG PO CAPS: 100.0000 mg | ORAL_CAPSULE | Freq: Three times a day (TID) | ORAL | 0 refills | Status: DC

## 2020-02-11 MED ORDER — FLUTICASONE PROPIONATE 50 MCG/ACT NA SUSP: 1.0000 | Freq: Every day | NASAL | 0 refills | Status: DC

## 2020-02-11 MED ORDER — DEXAMETHASONE 4 MG PO TABS: 4.0000 mg | ORAL_TABLET | Freq: Every day | ORAL | 0 refills | Status: AC

## 2020-02-11 MED ORDER — CETIRIZINE HCL 10 MG PO TABS: 10.0000 mg | ORAL_TABLET | Freq: Every day | ORAL | 0 refills | Status: DC

## 2020-02-11 NOTE — ED Provider Notes (Signed)
[place]   [id number] 02/11/20 Arrival Time: 3614   CC: COVID symptoms  SUBJECTIVE: History from: patient.  Colin Phillips is a 44 y.o. male who presented to the urgent care for complaint of sinus congestion, cough, body aches, and headache for the past 2 days.  Denies sick exposure to COVID, flu or strep.  Denies recent travel.  Has tried OTC medication with no relief.  Denies aggravating factors.  Denies previous symptoms in the past.   Denies fever, chills, fatigue, sinus pain, rhinorrhea, sore throat, SOB, wheezing, chest pain, nausea, changes in bowel or bladder habits.    ROS: As per HPI.  All other pertinent ROS negative.      Past Medical History:  Diagnosis Date  . Bipolar 1 disorder ([place])   . Bronchitis   . Chronic back pain   . Depression with anxiety   . Multiple sclerosis ([place])   . Nausea    Past Surgical History:  Procedure Laterality Date  . COLONOSCOPY  09/27/2019  . None    . UPPER GASTROINTESTINAL ENDOSCOPY  09/27/2019   No Known Allergies No current facility-administered medications on file prior to encounter.   Current Outpatient Medications on File Prior to Encounter  Medication Sig Dispense Refill  . ALPRAZolam (XANAX) 1 MG tablet Take 1 mg by mouth 3 (three) times daily as needed. For anxiety    . ascorbic acid (VITAMIN C) 500 MG tablet Take 500 mg by mouth daily.    . Cholecalciferol (VITAMIN D3) 2000 units TABS Take 1 capsule by mouth daily.      Social History   Socioeconomic History  . Marital status: Single    Spouse name: Not on file  . Number of children: 0  . Years of education: 68  . Highest education level: Not on file  Occupational History    Comment: unemployed  Tobacco Use  . Smoking status: Current Every Day Smoker    Types: Cigars  . Smokeless tobacco: Never Used  Substance and Sexual Activity  . Alcohol use: Yes    Comment: 6pack per day  . Drug use: No  . Sexual activity: Not on file  Other Topics  Concern  . Not on file  Social History Narrative   Patient is unemployed.    Patient lives at home with his father, niece and nephew.   Patient is right handed.   Patient drinks sodas mountain dews   Social Determinants of Health   Financial Resource Strain:   . Difficulty of Paying Living Expenses: Not on file  Food Insecurity:   . Worried About Charity fundraiser in the Last Year: Not on file  . Ran Out of Food in the Last Year: Not on file  Transportation Needs:   . Lack of Transportation (Medical): Not on file  . Lack of Transportation (Non-Medical): Not on file  Physical Activity:   . Days of Exercise per Week: Not on file  . Minutes of Exercise per Session: Not on file  Stress:   . Feeling of Stress : Not on file  Social Connections:   . Frequency of Communication with Friends and Family: Not on file  . Frequency of Social Gatherings with Friends and Family: Not on file  . Attends Religious Services: Not on file  . Active Member of Clubs or Organizations: Not on file  . Attends Archivist Meetings: Not on file  . Marital Status: Not on file  Intimate Partner Violence:   .  Fear of Current or Ex-Partner: Not on file  . Emotionally Abused: Not on file  . Physically Abused: Not on file  . Sexually Abused: Not on file   Family History  Problem Relation Age of Onset  . Cancer Mother   . Cancer Other        grandfather  . Cancer Cousin   . Colon cancer Neg Hx   . Esophageal cancer Neg Hx   . Rectal cancer Neg Hx   . Stomach cancer Neg Hx     OBJECTIVE:  Vitals:   02/11/20 0959  BP: 121/80  Pulse: 89  Resp: 16  Temp: 97.8 F (36.6 C)  TempSrc: Oral  SpO2: 98%     General appearance: alert; appears fatigued, but nontoxic; speaking in full sentences and tolerating own secretions HEENT: NCAT; Ears: EACs clear, TMs pearly gray; Eyes: PERRL.  EOM grossly intact. Sinuses: nontender; Nose: nares patent without rhinorrhea, Throat: oropharynx clear,  tonsils non erythematous or enlarged, uvula midline  Neck: supple without LAD Lungs: unlabored respirations, symmetrical air entry; cough: moderate; no respiratory distress; CTAB Heart: regular rate and rhythm.  Radial pulses 2+ symmetrical bilaterally Skin: warm and dry Psychological: alert and cooperative; normal mood and affect  LABS:  No results found for this or any previous visit (from the past 24 hour(s)).   ASSESSMENT & PLAN:  1. Viral URI with cough   2. Encounter for screening for COVID-19     Meds ordered this encounter  Medications  . cetirizine (ZYRTEC ALLERGY) 10 MG tablet    Sig: Take 1 tablet (10 mg total) by mouth daily.    Dispense:  30 tablet    Refill:  0  . fluticasone (FLONASE) 50 MCG/ACT nasal spray    Sig: Place 1 spray into both nostrils daily for 14 days.    Dispense:  16 g    Refill:  0  . benzonatate (TESSALON) 100 MG capsule    Sig: Take 1 capsule (100 mg total) by mouth every 8 (eight) hours.    Dispense:  30 capsule    Refill:  0  . dexamethasone (DECADRON) 4 MG tablet    Sig: Take 1 tablet (4 mg total) by mouth daily for 7 days.    Dispense:  7 tablet    Refill:  0    Discharge instructions  COVID testing ordered.  It will take between 2-7 days for test results.  Someone will contact you regarding abnormal results.    In the meantime: You should remain isolated in your home for 10 days from symptom onset AND greater than 24 hours after symptoms resolution (absence of fever without the use of fever-reducing medication and improvement in respiratory symptoms), whichever is longer Get plenty of rest and push fluids Tessalon Perles prescribed for cough Zyrtec for nasal congestion, runny nose, and/or sore throat Flonase for nasal congestion and runny nose Decadron as prescribed Use medications daily for symptom relief Use OTC medications like ibuprofen or tylenol as needed fever or pain Call or go to the ED if you have any new or worsening  symptoms such as fever, worsening cough, shortness of breath, chest tightness, chest pain, turning blue, changes in mental status, etc...   Reviewed expectations re: course of current medical issues. Questions answered. Outlined signs and symptoms indicating need for more acute intervention. Patient verbalized understanding. After Visit Summary given.    Note: This document was prepared using Dragon voice recognition software and may include unintentional dictation errors.  Emerson Monte, FNP 02/11/20 1051

## 2020-02-11 NOTE — ED Triage Notes (Signed)
Patient states that he has sinus congestion, body aches, headache x 2 days.

## 2020-02-11 NOTE — Discharge Instructions (Addendum)
COVID testing ordered.  It will take between 2-7 days for test results.  Someone will contact you regarding abnormal results.    In the meantime: You should remain isolated in your home for 10 days from symptom onset AND greater than 24 hours after symptoms resolution (absence of fever without the use of fever-reducing medication and improvement in respiratory symptoms), whichever is longer Get plenty of rest and push fluids Tessalon Perles prescribed for cough Zyrtec for nasal congestion, runny nose, and/or sore throat Flonase for nasal congestion and runny nose Decadron as prescribed Use medications daily for symptom relief Use OTC medications like ibuprofen or tylenol as needed fever or pain Call or go to the ED if you have any new or worsening symptoms such as fever, worsening cough, shortness of breath, chest tightness, chest pain, turning blue, changes in mental status, etc..Marland Kitchen

## 2020-02-12 LAB — SARS-COV-2, NAA 2 DAY TAT

## 2020-02-12 LAB — NOVEL CORONAVIRUS, NAA: SARS-CoV-2, NAA: NOT DETECTED

## 2020-06-14 ENCOUNTER — Emergency Department (HOSPITAL_COMMUNITY)
Admission: EM | Admit: 2020-06-14 | Discharge: 2020-06-14 | Disposition: A | Discharge status: Home / Self Care | Payer: 59 | Attending: Emergency Medicine | Admitting: Emergency Medicine

## 2020-06-14 ENCOUNTER — Encounter (HOSPITAL_COMMUNITY): Payer: Self-pay | Admitting: *Deleted

## 2020-06-14 ENCOUNTER — Other Ambulatory Visit: Payer: Self-pay

## 2020-06-14 DIAGNOSIS — F17290 Nicotine dependence, other tobacco product, uncomplicated: Secondary | ICD-10-CM | POA: Diagnosis not present

## 2020-06-14 DIAGNOSIS — U071 COVID-19: Secondary | ICD-10-CM | POA: Diagnosis not present

## 2020-06-14 DIAGNOSIS — R059 Cough, unspecified: Secondary | ICD-10-CM | POA: Diagnosis present

## 2020-06-14 MED ORDER — ONDANSETRON 4 MG PO TBDP: 4.0000 mg | ORAL_TABLET | Freq: Three times a day (TID) | ORAL | 0 refills | Status: DC | PRN

## 2020-06-14 MED ORDER — BENZONATATE 100 MG PO CAPS: 100.0000 mg | ORAL_CAPSULE | Freq: Four times a day (QID) | ORAL | 0 refills | Status: AC | PRN

## 2020-06-14 NOTE — ED Triage Notes (Signed)
Pt with fever since Saturday, unknown the highest.  Pt with chills, nausea, fatigue.  Tested on 1/13 and resulted yesterday that was positive.

## 2020-06-14 NOTE — Discharge Instructions (Signed)
Return if any problems.

## 2020-06-14 NOTE — ED Provider Notes (Signed)
[facility] EMERGENCY DEPARTMENT Provider Note   CSN: [id number] Arrival date & time: 06/14/20  1442     History Chief Complaint  Patient presents with  . Covid     Colin Phillips is a 45 y.o. male.  The history is provided by the patient. No language interpreter was used.  Cough Cough characteristics:  Non-productive Sputum characteristics:  Nondescript Severity:  Mild Onset quality:  Gradual Duration:  1 week Timing:  Constant Progression:  Worsening Relieved by:  Nothing Worsened by:  Nothing Associated symptoms: no shortness of breath    Pt reports he became sick one week ago.  Pt tested positive for covid.  Pt complains of continued cough and nausea.     Past Medical History:  Diagnosis Date  . Bipolar 1 disorder ([place])   . Bronchitis   . Chronic back pain   . Depression with anxiety   . Multiple sclerosis ([place])   . Nausea     Patient Active Problem List   Diagnosis Date Noted  . Fatigue 05/18/2016  . Paresthesia 05/18/2016  . Multiple sclerosis ([place])   . Bipolar 1 disorder ([place])   . Chronic back pain   . Depression with anxiety   . GERD 01/14/2007    Past Surgical History:  Procedure Laterality Date  . COLONOSCOPY  09/27/2019  . None    . UPPER GASTROINTESTINAL ENDOSCOPY  09/27/2019       Family History  Problem Relation Age of Onset  . Cancer Mother   . Cancer Other        grandfather  . Cancer Cousin   . Colon cancer Neg Hx   . Esophageal cancer Neg Hx   . Rectal cancer Neg Hx   . Stomach cancer Neg Hx     Social History   Tobacco Use  . Smoking status: Current Every Day Smoker    Types: Cigars  . Smokeless tobacco: Never Used  Substance Use Topics  . Alcohol use: Yes    Comment: 6pack per day  . Drug use: No    Home Medications Prior to Admission medications   Medication Sig Start Date End Date Taking? Authorizing Provider  benzonatate (TESSALON PERLES) 100 MG capsule Take 1 capsule (100 mg total) by mouth every 6  (six) hours as needed for cough. 06/14/20 06/14/21 Yes Caryl Ada K, PA-C  ondansetron (ZOFRAN ODT) 4 MG disintegrating tablet Take 1 tablet (4 mg total) by mouth every 8 (eight) hours as needed for nausea or vomiting. 06/14/20  Yes Caryl Ada K, PA-C  ALPRAZolam Duanne Moron) 1 MG tablet Take 1 mg by mouth 3 (three) times daily as needed. For anxiety    [provider]  ascorbic acid (VITAMIN C) 500 MG tablet Take 500 mg by mouth daily.    [provider]  cetirizine (ZYRTEC ALLERGY) 10 MG tablet Take 1 tablet (10 mg total) by mouth daily. 02/11/20   Avegno, Darrelyn Hillock, FNP  Cholecalciferol (VITAMIN D3) 2000 units TABS Take 1 capsule by mouth daily.     [provider]  fluticasone (FLONASE) 50 MCG/ACT nasal spray Place 1 spray into both nostrils daily for 14 days. 02/11/20 02/25/20  Emerson Monte, FNP    Allergies    Patient has no known allergies.  Review of Systems   Review of Systems  Respiratory: Positive for cough. Negative for shortness of breath.   All other systems reviewed and are negative.   Physical Exam Updated Vital Signs BP 134/89 (BP Location:  Right Arm)   Pulse 91   Temp 97.7 F (36.5 C) (Temporal)   Resp 16   Ht 5\' 6"  (1.676 m)   Wt 72.6 kg   SpO2 98%   BMI 25.82 kg/m   Physical Exam Vitals and nursing note reviewed.  Constitutional:      Appearance: He is well-developed and well-nourished.  HENT:     Head: Normocephalic and atraumatic.  Eyes:     Conjunctiva/sclera: Conjunctivae normal.  Cardiovascular:     Rate and Rhythm: Normal rate and regular rhythm.     Heart sounds: No murmur heard.   Pulmonary:     Effort: Pulmonary effort is normal. No respiratory distress.     Breath sounds: Normal breath sounds.  Abdominal:     Palpations: Abdomen is soft.     Tenderness: There is no abdominal tenderness.  Musculoskeletal:        General: No edema. Normal range of motion.     Cervical back: Neck supple.  Skin:    General:  Skin is warm and dry.  Neurological:     General: No focal deficit present.     Mental Status: He is alert.  Psychiatric:        Mood and Affect: Mood and affect and mood normal.     ED Results / Procedures / Treatments   Labs (all labs ordered are listed, but only abnormal results are displayed) Labs Reviewed - No data to display  EKG None  Radiology No results found.  Procedures Procedures (including critical care time)  Medications Ordered in ED Medications - No data to display  ED Course  I have reviewed the triage vital signs and the nursing notes.  Pertinent labs & imaging results that were available during my care of the patient were reviewed by me and considered in my medical decision making (see chart for details).    MDM Rules/Calculators/A&P                          MDM:  Pt needs a note for work.  Pt given rx for zofran and tessalon.   Final Clinical Impression(s) / ED Diagnoses Final diagnoses:  COVID    Rx / DC Orders ED Discharge Orders         Ordered    ondansetron (ZOFRAN ODT) 4 MG disintegrating tablet  Every 8 hours PRN        06/14/20 1604    benzonatate (TESSALON PERLES) 100 MG capsule  Every 6 hours PRN        06/14/20 1604        An After Visit Summary was printed and given to the patient.    Fransico Meadow, [place] 06/14/20 1608    Margette Fast, MD 06/15/20 1355

## 2020-07-22 ENCOUNTER — Encounter: Payer: Self-pay | Admitting: Emergency Medicine

## 2020-07-22 ENCOUNTER — Ambulatory Visit
Admission: EM | Admit: 2020-07-22 | Discharge: 2020-07-22 | Disposition: A | Discharge status: Home / Self Care | Payer: 59

## 2020-07-22 ENCOUNTER — Other Ambulatory Visit: Payer: Self-pay

## 2020-07-22 DIAGNOSIS — R112 Nausea with vomiting, unspecified: Secondary | ICD-10-CM

## 2020-07-22 NOTE — Discharge Instructions (Addendum)
Get rest and drink fluids Start using Zofran as directed.    DIET Instructions:  30 minutes after taking nausea medicine, begin with sips of clear liquids. If able to hold down 2 - 4 ounces for 30 minutes, begin drinking more. Increase your fluid intake to replace losses. Clear liquids only for 24 hours (water, tea, sport drinks, clear flat ginger ale or cola and juices, broth, jello, popsicles, ect). Advance to bland foods, applesauce, rice, baked or boiled chicken, ect. Avoid milk, greasy foods and anything that doesnt agree with you.  If you experience new or worsening symptoms return or go to ER such as fever, chills, nausea, vomiting, diarrhea, bloody or dark tarry stools, constipation, urinary symptoms, worsening abdominal discomfort, symptoms that do not improve with medications, inability to keep fluids down, etc..Marland Kitchen

## 2020-07-22 NOTE — ED Triage Notes (Signed)
Vomiting started last night.  Feel week

## 2020-07-22 NOTE — ED Provider Notes (Signed)
[place]   [id number] 07/22/20 Arrival Time: 0805  Chief Complaint  Patient presents with  . Emesis    SUBJECTIVE:  Colin Phillips is a 45 y.o. male presented to the urgent care with a complaint of emesis and weakness that started last night.  Denies any precipitating event.  Describes the symptoms as intermittent.  Has tried OTC medications without relief.  Symptoms are made worse with eating.  Reports similar symptoms in the past that resolved with Zofran.  Has Zofran in the house but has not started using it yet.  Denies fever, chills, appetite change, weight change, chest pain, changes in bowel or bladder habits.  No LMP for male patient.  ROS: As per HPI.  All other pertinent ROS negative.     Past Medical History:  Diagnosis Date  . Bipolar 1 disorder ([place])   . Bronchitis   . Chronic back pain   . Depression with anxiety   . Multiple sclerosis ([place])   . Nausea    Past Surgical History:  Procedure Laterality Date  . COLONOSCOPY  09/27/2019  . None    . UPPER GASTROINTESTINAL ENDOSCOPY  09/27/2019   No Known Allergies No current facility-administered medications on file prior to encounter.   Current Outpatient Medications on File Prior to Encounter  Medication Sig Dispense Refill  . ALPRAZolam (XANAX) 1 MG tablet Take 1 mg by mouth 3 (three) times daily as needed. For anxiety    . ascorbic acid (VITAMIN C) 500 MG tablet Take 500 mg by mouth daily.    . benzonatate (TESSALON PERLES) 100 MG capsule Take 1 capsule (100 mg total) by mouth every 6 (six) hours as needed for cough. 30 capsule 0  . cetirizine (ZYRTEC ALLERGY) 10 MG tablet Take 1 tablet (10 mg total) by mouth daily. 30 tablet 0  . Cholecalciferol (VITAMIN D3) 2000 units TABS Take 1 capsule by mouth daily.     . fluticasone (FLONASE) 50 MCG/ACT nasal spray Place 1 spray into both nostrils daily for 14 days. 16 g 0  . ondansetron (ZOFRAN ODT) 4 MG disintegrating tablet Take 1 tablet (4 mg total)  by mouth every 8 (eight) hours as needed for nausea or vomiting. 20 tablet 0   Social History   Socioeconomic History  . Marital status: Single    Spouse name: Not on file  . Number of children: 0  . Years of education: 7  . Highest education level: Not on file  Occupational History    Comment: unemployed  Tobacco Use  . Smoking status: Former Smoker    Types: Cigars  . Smokeless tobacco: Never Used  Vaping Use  . Vaping Use: Never used  Substance and Sexual Activity  . Alcohol use: Yes    Comment: 6pack per day  . Drug use: No  . Sexual activity: Not on file  Other Topics Concern  . Not on file  Social History Narrative   Patient is unemployed.    Patient lives at home with his father, niece and nephew.   Patient is right handed.   Patient drinks sodas [place] dews   Social Determinants of Health   Financial Resource Strain: Not on file  Food Insecurity: Not on file  Transportation Needs: Not on file  Physical Activity: Not on file  Stress: Not on file  Social Connections: Not on file  Intimate Partner Violence: Not on file   Family History  Problem Relation Age of Onset  . Cancer Mother   .  Cancer Other        grandfather  . Cancer Cousin   . Colon cancer Neg Hx   . Esophageal cancer Neg Hx   . Rectal cancer Neg Hx   . Stomach cancer Neg Hx      OBJECTIVE:  Vitals:   07/22/20 0822  BP: 116/79  Pulse: 89  Resp: 18  Temp: 98.2 F (36.8 C)  TempSrc: Oral  SpO2: 97%    General appearance: Alert; NAD HEENT: NCAT.  Oropharynx clear.  Lungs: clear to auscultation bilaterally without adventitious breath sounds Heart: regular rate and rhythm.  Radial pulses 2+ symmetrical bilaterally Abdomen: soft, non-distended; normal active bowel sounds; non-tender to light and deep palpation; nontender at McBurney's point; negative Murphy's sign; negative rebound; no guarding Back: no CVA tenderness Extremities: no edema; symmetrical with no gross  deformities Skin: warm and dry Neurologic: normal gait Psychological: alert and cooperative; normal mood and affect  LABS: No results found for this or any previous visit (from the past 24 hour(s)).  DIAGNOSTIC STUDIES: No results found.   ASSESSMENT & PLAN:  1. Non-intractable vomiting with nausea, unspecified vomiting type     No orders of the defined types were placed in this encounter.  Patient is stable at discharge.  He was advised to have a Covid-19 test done for rule out, he declined.  Discharge Instructions  Get rest and drink fluids Start using Zofran as directed.    DIET Instructions:  30 minutes after taking nausea medicine, begin with sips of clear liquids. If able to hold down 2 - 4 ounces for 30 minutes, begin drinking more. Increase your fluid intake to replace losses. Clear liquids only for 24 hours (water, tea, sport drinks, clear flat ginger ale or cola and juices, broth, jello, popsicles, ect). Advance to bland foods, applesauce, rice, baked or boiled chicken, ect. Avoid milk, greasy foods and anything that doesn't agree with you.  If you experience new or worsening symptoms return or go to ER such as fever, chills, nausea, vomiting, diarrhea, bloody or dark tarry stools, constipation, urinary symptoms, worsening abdominal discomfort, symptoms that do not improve with medications, inability to keep fluids down, etc...  Reviewed expectations re: course of current medical issues. Questions answered. Outlined signs and symptoms indicating need for more acute intervention. Patient verbalized understanding. After Visit Summary given.   Emerson Monte, [place] 07/22/20 [phone]

## 2020-10-29 ENCOUNTER — Institutional Professional Consult (permissible substitution): Payer: 59 | Admitting: Neurology

## 2021-01-13 ENCOUNTER — Institutional Professional Consult (permissible substitution): Payer: Self-pay | Admitting: Neurology

## 2021-08-22 ENCOUNTER — Encounter: Payer: Self-pay | Admitting: Emergency Medicine

## 2021-08-22 ENCOUNTER — Ambulatory Visit
Admission: EM | Admit: 2021-08-22 | Discharge: 2021-08-22 | Disposition: A | Discharge status: Home / Self Care | Payer: 59 | Attending: Student | Admitting: Student

## 2021-08-22 ENCOUNTER — Other Ambulatory Visit: Payer: Self-pay

## 2021-08-22 DIAGNOSIS — J208 Acute bronchitis due to other specified organisms: Secondary | ICD-10-CM | POA: Diagnosis not present

## 2021-08-22 DIAGNOSIS — Z0289 Encounter for other administrative examinations: Secondary | ICD-10-CM

## 2021-08-22 MED ORDER — PREDNISONE 10 MG (21) PO TBPK: ORAL_TABLET | Freq: Every day | ORAL | 0 refills | Status: AC

## 2021-08-22 MED ORDER — BENZONATATE 100 MG PO CAPS: 100.0000 mg | ORAL_CAPSULE | Freq: Three times a day (TID) | ORAL | 0 refills | Status: AC

## 2021-08-22 MED ORDER — ALBUTEROL SULFATE HFA 108 (90 BASE) MCG/ACT IN AERS: 1.0000 | INHALATION_SPRAY | Freq: Four times a day (QID) | RESPIRATORY_TRACT | 0 refills | Status: DC | PRN

## 2021-08-22 NOTE — ED Triage Notes (Signed)
Fever and chills on Monday with  Nasal congestion and productive cough.   ?

## 2021-08-22 NOTE — ED Provider Notes (Signed)
?[place] ? ? ? ?CSN: [id number] ?Arrival date & time: 08/22/21  0818 ? ? ?  ? ?History   ?Chief Complaint ?Chief Complaint  ?Patient presents with  ? cough, congestion  ? ? ?HPI ?Colin Phillips is a 46 y.o. male presenting with cough and congestion for 5 days.  History former smoker, bronchitis; no formal diagnosis of pulmonary disease.  Describes subjective fevers and chills for about 5 days with nasal congestion and cough productive of clear and yellow sputum.  States sputum is primarily clear.  He states he only has shortness of breath during coughing episodes, which tend to be worse in the morning after waking up.  There is no chest pain, dizziness, weakness.  Has not monitored temperature at home.  States he took some ibuprofen and 1 pill of Tessalon he had at home with some relief.  Requesting a work note for 3 days. ? ?HPI ? ?Past Medical History:  ?Diagnosis Date  ? Bipolar 1 disorder ([place])   ? Bronchitis   ? Chronic back pain   ? Depression with anxiety   ? Multiple sclerosis ([place])   ? Nausea   ? ? ?Patient Active Problem List  ? Diagnosis Date Noted  ? Fatigue 05/18/2016  ? Paresthesia 05/18/2016  ? Multiple sclerosis ([place])   ? Bipolar 1 disorder ([place])   ? Chronic back pain   ? Depression with anxiety   ? GERD 01/14/2007  ? ? ?Past Surgical History:  ?Procedure Laterality Date  ? COLONOSCOPY  09/27/2019  ? None    ? UPPER GASTROINTESTINAL ENDOSCOPY  09/27/2019  ? ? ? ? ? ?Home Medications   ? ?Prior to Admission medications   ?Medication Sig Start Date End Date Taking? Authorizing Provider  ?albuterol (VENTOLIN HFA) 108 (90 Base) MCG/ACT inhaler Inhale 1-2 puffs into the lungs every 6 (six) hours as needed for wheezing or shortness of breath. 08/22/21  Yes Hazel Sams, PA-C  ?benzonatate (TESSALON) 100 MG capsule Take 1 capsule (100 mg total) by mouth every 8 (eight) hours. 08/22/21  Yes Hazel Sams, PA-C  ?predniSONE (STERAPRED UNI-PAK 21 TAB) 10 MG (21) TBPK tablet Take by mouth  daily. Take 6 tabs by mouth daily  for 2 days, then 5 tabs for 2 days, then 4 tabs for 2 days, then 3 tabs for 2 days, 2 tabs for 2 days, then 1 tab by mouth daily for 2 days 08/22/21  Yes Hazel Sams, PA-C  ?ALPRAZolam (XANAX) 1 MG tablet Take 1 mg by mouth 3 (three) times daily as needed. For anxiety    [provider]  ?ascorbic acid (VITAMIN C) 500 MG tablet Take 500 mg by mouth daily.    [provider]  ?Cholecalciferol (VITAMIN D3) 2000 units TABS Take 1 capsule by mouth daily.     [provider]  ? ? ?Family History ?Family History  ?Problem Relation Age of Onset  ? Cancer Mother   ? Cancer Other   ?     grandfather  ? Cancer Cousin   ? Colon cancer Neg Hx   ? Esophageal cancer Neg Hx   ? Rectal cancer Neg Hx   ? Stomach cancer Neg Hx   ? ? ?Social History ?Social History  ? ?Tobacco Use  ? Smoking status: Former  ?  Types: Cigars  ?  Passive exposure: Current  ? Smokeless tobacco: Never  ?Vaping Use  ? Vaping Use: Never used  ?Substance Use Topics  ? Alcohol  use: Yes  ?  Comment: 6pack per day  ? Drug use: No  ? ? ? ?Allergies   ?Patient has no known allergies. ? ? ?Review of Systems ?Review of Systems  ?Constitutional:  Negative for appetite change, chills and fever.  ?HENT:  Positive for congestion. Negative for ear pain, rhinorrhea, sinus pressure, sinus pain and sore throat.   ?Eyes:  Negative for redness and visual disturbance.  ?Respiratory:  Positive for cough. Negative for chest tightness, shortness of breath and wheezing.   ?Cardiovascular:  Negative for chest pain and palpitations.  ?Gastrointestinal:  Negative for abdominal pain, constipation, diarrhea, nausea and vomiting.  ?Genitourinary:  Negative for dysuria, frequency and urgency.  ?Musculoskeletal:  Negative for myalgias.  ?Neurological:  Negative for dizziness, weakness and headaches.  ?Psychiatric/Behavioral:  Negative for confusion.   ?All other systems reviewed and are negative. ? ? ?Physical Exam ?Triage  Vital Signs ?ED Triage Vitals  ?Enc Vitals Group  ?   BP 08/22/21 0837 123/76  ?   Pulse Rate 08/22/21 0837 82  ?   Resp 08/22/21 0837 18  ?   Temp 08/22/21 0837 98 ?F (36.7 ?C)  ?   Temp Source 08/22/21 0837 Oral  ?   SpO2 08/22/21 0837 96 %  ?   Weight --   ?   Height --   ?   Head Circumference --   ?   Peak Flow --   ?   Pain Score 08/22/21 0838 0  ?   Pain Loc --   ?   Pain Edu? --   ?   Excl. in [place]? --   ? ?No data found. ? ?Updated Vital Signs ?BP 123/76 (BP Location: Right Arm)   Pulse 82   Temp 98 ?F (36.7 ?C) (Oral)   Resp 18   SpO2 96%  ? ?Visual Acuity ?Right Eye Distance:   ?Left Eye Distance:   ?Bilateral Distance:   ? ?Right Eye Near:   ?Left Eye Near:    ?Bilateral Near:    ? ?Physical Exam ?Vitals reviewed.  ?Constitutional:   ?   General: He is not in acute distress. ?   Appearance: Normal appearance. He is not ill-appearing.  ?HENT:  ?   Head: Normocephalic and atraumatic.  ?   Right Ear: Tympanic membrane, ear canal and external ear normal. No tenderness. No middle ear effusion. There is no impacted cerumen. Tympanic membrane is not perforated, erythematous, retracted or bulging.  ?   Left Ear: Tympanic membrane, ear canal and external ear normal. No tenderness.  No middle ear effusion. There is no impacted cerumen. Tympanic membrane is not perforated, erythematous, retracted or bulging.  ?   Nose: Nose normal. No congestion.  ?   Mouth/Throat:  ?   Mouth: Mucous membranes are moist.  ?   Pharynx: Uvula midline. No oropharyngeal exudate or posterior oropharyngeal erythema.  ?Eyes:  ?   Extraocular Movements: Extraocular movements intact.  ?   Pupils: Pupils are equal, round, and reactive to light.  ?Cardiovascular:  ?   Rate and Rhythm: Normal rate and regular rhythm.  ?   Heart sounds: Normal heart sounds.  ?Pulmonary:  ?   Effort: Pulmonary effort is normal.  ?   Breath sounds: Normal breath sounds. No decreased breath sounds, wheezing, rhonchi or rales.  ?Abdominal:  ?   Palpations:  Abdomen is soft.  ?   Tenderness: There is no abdominal tenderness. There is no guarding or rebound.  ?Lymphadenopathy:  ?  Cervical: No cervical adenopathy.  ?   Right cervical: No superficial cervical adenopathy. ?   Left cervical: No superficial cervical adenopathy.  ?Neurological:  ?   General: No focal deficit present.  ?   Mental Status: He is alert and oriented to person, place, and time.  ?Psychiatric:     ?   Mood and Affect: Mood normal.     ?   Behavior: Behavior normal.     ?   Thought Content: Thought content normal.     ?   Judgment: Judgment normal.  ? ? ? ?UC Treatments / Results  ?Labs ?(all labs ordered are listed, but only abnormal results are displayed) ?Labs Reviewed - No data to display ? ?EKG ? ? ?Radiology ?No results found. ? ?Procedures ?Procedures (including critical care time) ? ?Medications Ordered in UC ?Medications - No data to display ? ?Initial Impression / Assessment and Plan / UC Course  ?I have reviewed the triage vital signs and the nursing notes. ? ?Pertinent labs & imaging results that were available during my care of the patient were reviewed by me and considered in my medical decision making (see chart for details). ? ?  ? ?This patient is a very pleasant 46 y.o. year old male presenting with bronchitis following viral syndrome. Today this pt is afebrile nontachycardic nontachypneic, oxygenating well on room air, no wheezes rhonchi or rales. Nontoxic appearing. ? ?Did not check covid or influenza testing given duration of symptoms >5 days.  ? ?Prednisone taper, tessalon, albuterol. Work note x3 days provided. ED return precautions discussed. Patient verbalizes understanding and agreement.  ?  ? ?Final Clinical Impressions(s) / UC Diagnoses  ? ?Final diagnoses:  ?Viral bronchitis  ?Encounter to obtain excuse from work  ? ? ? ?Discharge Instructions   ? ?  ?-You have bronchitis. Bronchitis is an inflammation of the lining of your bronchial tubes, which carry air to and from  your lungs. This typically occurs after a virus, like a cold virus. People who have bronchitis often cough up thickened mucus, which can be discolored. This isn't a bacterial infection, so you don't need antibioti

## 2021-08-22 NOTE — Discharge Instructions (Addendum)
-  You have bronchitis. Bronchitis is an inflammation of the lining of your bronchial tubes, which carry air to and from your lungs. This typically occurs after a virus, like a cold virus. People who have bronchitis often cough up thickened mucus, which can be discolored. This isn't a bacterial infection, so you don't need antibiotics. We treat it with medications to help reduce inflammation and open up your lungs.  ?-Prednisone taper for cough/bronchitis. I recommend taking this in the morning as it could give you energy.  Avoid NSAIDs like ibuprofen and alleve while taking this medication as they can increase your risk of stomach upset and even GI bleeding when in combination with a steroid. You can continue tylenol (acetaminophen) up to '1000mg'$  3x daily. ?-Tessalon (Benzonatate) as needed for cough. Take one pill up to 3x daily (every 8 hours) ?-Albuterol inhaler as needed for cough, wheezing, shortness of breath, 1 to 2 puffs every 6 hours as needed. ?-Follow-up if symptoms getting worse instead of better- shortness of breath, coughing up dark or red sputum, new chest pain at rest, new fevers, etc. ?-With a virus, you're typically contagious for 5-7 days, or as long as you're having fevers.  ? ? ?

## 2021-09-23 ENCOUNTER — Ambulatory Visit (INDEPENDENT_AMBULATORY_CARE_PROVIDER_SITE_OTHER): Payer: 59 | Admitting: Behavioral Health

## 2021-09-23 ENCOUNTER — Encounter: Payer: Self-pay | Admitting: Behavioral Health

## 2021-09-23 VITALS — BP 122/84 | HR 88 | Ht 67.0 in | Wt 173.0 lb

## 2021-09-23 DIAGNOSIS — F319 Bipolar disorder, unspecified: Secondary | ICD-10-CM

## 2021-09-23 DIAGNOSIS — F411 Generalized anxiety disorder: Secondary | ICD-10-CM | POA: Diagnosis not present

## 2021-09-23 DIAGNOSIS — F410 Panic disorder [episodic paroxysmal anxiety] without agoraphobia: Secondary | ICD-10-CM | POA: Diagnosis not present

## 2021-09-23 DIAGNOSIS — F331 Major depressive disorder, recurrent, moderate: Secondary | ICD-10-CM | POA: Diagnosis not present

## 2021-09-23 NOTE — Progress Notes (Signed)
[facility] MD/PA/NP Initial Note ? ?09/23/2021 1:09 PM ?Jesus Genera  ?MRN:  [id number] ? ?Chief Complaint:  ?Chief Complaint   ?Anxiety; Depression; Establish Care; Medication Problem; Patient Education ?  ? ? ?HPI:  ? ?46 year old male presents to this office for initial visit and to establish care. He says that he started struggling with severe anxiety and depression back in 2009. He was forced to quit his job. He started seeing psychiatrist Dustin Flock during that time frame and was diagnosed with Bipolar disorder, MDD, and Generalized Anxiety.  Says that he started to experience "dark years" and lived in his fathers basement  from 2009-2018. 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 Says that he has been on same meds for most of this time period.  For now he says that he does not want anyone to adjust his med but just needs a new provider for medication management. Say his provider Launa Flight was retiring. He would like to continue on this regimen. 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 He denies any mania since before 2009 but could not articulate any previous episodes of mania. Says that he was just deeply depressed especially after receiving a dx of MS. He feels medications related to that disease made him worse and he is better since coming off medications he could not provide names of this visit. He denies any current or hx of psychosis. No SI/HI. ? ?Previous psychiatric medications: ?Requesting he obtain copy of his previous medical records. ?Cymbalta ?Risperidone ?Zoloft ?Lamictal ?  ? ?Visit Diagnosis:  ?  ICD-10-CM   ?1. Bipolar I disorder ([place])  F31.9   ?  ?2. Generalized anxiety disorder  F41.1   ?  ?3. Major depressive disorder, recurrent episode, moderate (HCC)  F33.1   ?  ?4. Panic attacks  F41.0   ?  ? ? ?Past Psychiatric History: Bipolar 1, MDD, Anxiety, Panic ? ?Past Medical History:  ?Past Medical History:   ?Diagnosis Date  ? Bipolar 1 disorder ([place])   ? Bronchitis   ? Chronic back pain   ? Depression with anxiety   ? Multiple sclerosis ([place])   ? Nausea   ?  ?Past Surgical History:  ?Procedure Laterality Date  ? COLONOSCOPY  09/27/2019  ? None    ? UPPER GASTROINTESTINAL ENDOSCOPY  09/27/2019  ? ? ?Family Psychiatric History: see chart ? ?Family History:  ?Family History  ?Problem Relation Age of Onset  ? Cancer Mother   ? Alcohol abuse Father   ? Alcohol abuse Brother   ? Cancer Cousin   ? Cancer Other   ?     grandfather  ? Colon cancer Neg Hx   ? Esophageal cancer Neg Hx   ? Rectal cancer Neg Hx   ? Stomach cancer Neg Hx   ? ? ?Social History:  ?Social History  ? ?Socioeconomic History  ? Marital status: Significant Other  ?  Spouse name: Not on file  ? Number of children: 0  ? Years of education: 76  ? Highest education level: High school graduate  ?Occupational History  ?  Comment: unemployed  ?Tobacco Use  ? Smoking status: Former  ?  Types: Cigars  ?  Passive exposure: Current  ? Smokeless tobacco: Never  ?Vaping Use  ? Vaping Use: Never used  ?Substance and Sexual Activity  ? Alcohol use: Yes  ?  Comment: light  social  ? Drug use: No  ? Sexual activity: Yes  ?Other Topics Concern  ? Not on file  ?Social History Narrative  ? Patient is Recruitment consultant at [employer].  ? Lives with girlfriend and stepdaughter.  ? Patient is right handed.  ? Patient drinks sodas mountain dews  ? ?Social Determinants of Health  ? ?Financial Resource Strain: Not on file  ?Food Insecurity: Not on file  ?Transportation Needs: Not on file  ?Physical Activity: Not on file  ?Stress: Not on file  ?Social Connections: Not on file  ? ? ?Allergies: No Known Allergies ? ?Metabolic Disorder Labs: ?No results found for: HGBA1C, MPG ?No results found for: PROLACTIN ?No results found for: CHOL, TRIG, HDL, CHOLHDL, VLDL, LDLCALC ?Lab Results  ?Component Value Date  ? TSH 2.070 05/18/2016  ? ? ?Therapeutic Level Labs: ?No results found for:  LITHIUM ?No results found for: VALPROATE ?No components found for:  CBMZ ? ?Current Medications: ?Current Outpatient Medications  ?Medication Sig Dispense Refill  ? ALPRAZolam (XANAX) 1 MG tablet Take 1 mg by mouth 3 (three) times daily as needed. For anxiety    ? ascorbic acid (VITAMIN C) 500 MG tablet Take 500 mg by mouth daily.    ? benzonatate (TESSALON) 100 MG capsule Take 1 capsule (100 mg total) by mouth every 8 (eight) hours. 21 capsule 0  ? Cholecalciferol (VITAMIN D3) 2000 units TABS Take 1 capsule by mouth daily.     ? lamoTRIgine (LAMICTAL) 200 MG tablet Take 200 mg by mouth daily.    ? methocarbamol (ROBAXIN) 750 MG tablet Take 750 mg by mouth 3 (three) times daily.    ? predniSONE (STERAPRED UNI-PAK 21 TAB) 10 MG (21) TBPK tablet Take by mouth daily. Take 6 tabs by mouth daily  for 2 days, then 5 tabs for 2 days, then 4 tabs for 2 days, then 3 tabs for 2 days, 2 tabs for 2 days, then 1 tab by mouth daily for 2 days 42 tablet 0  ? risperidone (RISPERDAL) 4 MG tablet Take 4 mg by mouth at bedtime.    ? sertraline (ZOLOFT) 50 MG tablet Take 50 mg by mouth daily.    ? sildenafil (VIAGRA) 50 MG tablet SMARTSIG:1-2 Tablet(s) By Mouth    ? valACYclovir (VALTREX) 1000 MG tablet Take 1,000 mg by mouth 3 (three) times daily.    ? ?No current facility-administered medications for this visit.  ? ? ?Medication Side Effects: none ? ?Orders placed this visit:  No orders of the defined types were placed in this encounter. ? ? ?Psychiatric Specialty Exam: ? ?Review of Systems  ?Genitourinary:  Positive for frequency and urgency.  ?Musculoskeletal:  Positive for back pain and neck pain.   ?There were no vitals taken for this visit.There is no height or weight on file to calculate BMI.  ?General Appearance: Casual, Neat, and Well Groomed  ?Eye Contact:  Good  ?Speech:  Clear and Coherent  ?Volume:  Normal  ?Mood:  NA  ?Affect:  Appropriate  ?Thought Process:  Coherent  ?Orientation:  Full (Time, Place, and Person)   ?Thought Content: Logical   ?Suicidal Thoughts:  No  ?Homicidal Thoughts:  No  ?Memory:  WNL  ?Judgement:  Good  ?Insight:  Good  ?Psychomotor Activity:  Normal  ?Concentration:  Concentration: Good  ?Recall:  Good  ?Fund of Knowledge: Good  ?Language: Good  ?Assets:  Desire for Improvement  ?ADL's:  Intact  ?Cognition: WNL  ?Prognosis:  Good  ? ?  Screenings:  ?[place] ED from 08/22/2021 in [facility] at [facility] from 07/22/2020 in [facility] at [place]  ?C-SSRS RISK CATEGORY No Risk No Risk  ? ?  ? ? ?Receiving Psychotherapy: No  ? ?Treatment Plan/Recommendations:  ? ?Greater than 50% of 60 min face to face time with patient was spent on counseling and coordination of care. We discussed his very long hx of anxiety and deep depression over the last decade. He is not sure he agrees with bipolar dx. For now he does not want to changes his medications. I counseled him about use of bezo's but he is reluctant to reduce his medication at this time. He has not obtained any lab work in a very long time since 2021. He would like to complete next visit.  ?We agreed to: ?Will continue Risperidone 4 mg daily at bedtime ?To continue Lamictal 200 mg daily ?To continue Zoloft 50 mg daily ?To continue Xanax 1 mg three times daily for severe anxiety ?Will report worsening symptoms or side effects ?To follow up in 3 months as requested by pt ?Provided emergency contact information ?Discussed potential metabolic side effects associated with atypical antipsychotics, as well as potential risk for movement side effects. Advised pt to contact office if movement side effects occur.   ?Discussed potential benefits, risk, and side effects of benzodiazepines to include potential risk of tolerance and dependence, as well as possible drowsiness.  Advised patient not to drive if experiencing drowsiness and to take lowest possible effective dose to minimize risk of dependence and tolerance.  ?Reviewed  PDMP ? ? ?Elwanda Brooklyn, NP ? ?         ?

## 2021-11-24 ENCOUNTER — Encounter: Payer: Self-pay | Admitting: Behavioral Health

## 2021-11-24 ENCOUNTER — Ambulatory Visit: Payer: 59 | Admitting: Behavioral Health

## 2021-11-24 DIAGNOSIS — F410 Panic disorder [episodic paroxysmal anxiety] without agoraphobia: Secondary | ICD-10-CM

## 2021-11-24 DIAGNOSIS — F319 Bipolar disorder, unspecified: Secondary | ICD-10-CM | POA: Diagnosis not present

## 2021-11-24 DIAGNOSIS — F411 Generalized anxiety disorder: Secondary | ICD-10-CM

## 2021-11-24 DIAGNOSIS — F331 Major depressive disorder, recurrent, moderate: Secondary | ICD-10-CM | POA: Diagnosis not present

## 2021-11-24 MED ORDER — ALPRAZOLAM 1 MG PO TABS: 1.0000 mg | ORAL_TABLET | Freq: Three times a day (TID) | ORAL | 3 refills | Status: DC | PRN

## 2022-01-29 ENCOUNTER — Telehealth: Payer: Self-pay | Admitting: Behavioral Health

## 2022-01-29 ENCOUNTER — Other Ambulatory Visit: Payer: Self-pay

## 2022-01-29 MED ORDER — RISPERIDONE 4 MG PO TABS: 4.0000 mg | ORAL_TABLET | Freq: Every day | ORAL | 4 refills | Status: AC

## 2022-01-29 MED ORDER — LAMOTRIGINE 200 MG PO TABS: 200.0000 mg | ORAL_TABLET | Freq: Every day | ORAL | 4 refills | Status: AC

## 2022-01-29 MED ORDER — SERTRALINE HCL 50 MG PO TABS: 50.0000 mg | ORAL_TABLET | Freq: Every day | ORAL | 4 refills | Status: AC

## 2022-01-29 NOTE — Telephone Encounter (Signed)
Pt requesting Rx for Sertraline 50 mg, Lamotrigine 200 mg, and Risperidone 4 mg    [employer]. Apt 12/19

## 2022-01-29 NOTE — Telephone Encounter (Signed)
Rx sent 

## 2022-02-06 ENCOUNTER — Telehealth: Payer: Self-pay | Admitting: Psychiatry

## 2022-02-06 DIAGNOSIS — F410 Panic disorder [episodic paroxysmal anxiety] without agoraphobia: Secondary | ICD-10-CM

## 2022-02-06 DIAGNOSIS — F411 Generalized anxiety disorder: Secondary | ICD-10-CM

## 2022-02-06 NOTE — Telephone Encounter (Signed)
Due 9/11. Check with Aaron Edelman if patient should get TID routine or TID prn. Only getting 30 tablets per script.

## 2022-02-09 ENCOUNTER — Other Ambulatory Visit: Payer: Self-pay | Admitting: Behavioral Health

## 2022-02-09 DIAGNOSIS — F411 Generalized anxiety disorder: Secondary | ICD-10-CM

## 2022-02-09 DIAGNOSIS — F410 Panic disorder [episodic paroxysmal anxiety] without agoraphobia: Secondary | ICD-10-CM

## 2022-02-09 MED ORDER — ALPRAZOLAM 1 MG PO TABS: 1.0000 mg | ORAL_TABLET | Freq: Every day | ORAL | 3 refills | Status: DC

## 2022-02-09 NOTE — Telephone Encounter (Signed)
Trying to reduce his use. He only wants to come in every six months The last three fill have been for 30 tablets per month. The verbage on bottle is incorrect and I will correct to read one 1 mg tablet daily.

## 2022-02-09 NOTE — Telephone Encounter (Signed)
Pt called back today at 11:19 asking about the status of Xanax script.  He also questioned why it was only for 10 days (30# taken 3 times a day).  Next appt 12/19

## 2022-02-17 NOTE — Telephone Encounter (Signed)
Colin Phillips said that he was trying to wean patient off and that 1 tablet a day was correct.

## 2022-02-17 NOTE — Telephone Encounter (Signed)
Patient called today at 2:13 stating he was advised to call back in about eight days concerning his Xanax. Patient is stating the dosage is incorrect for the pharmacy. He is stating it should read 3 a day not 1 a day. Please advise.  Contact information # [phone]

## 2022-02-17 NOTE — Telephone Encounter (Signed)
LVM to [facility] 

## 2022-02-18 ENCOUNTER — Other Ambulatory Visit: Payer: Self-pay

## 2022-02-18 ENCOUNTER — Other Ambulatory Visit: Payer: Self-pay | Admitting: Behavioral Health

## 2022-02-18 NOTE — Telephone Encounter (Signed)
Colin Phillips lvm concerning the Xanax refill. He stated he has spoke with the nurse previously, but he feels she is not understanding him. He is requesting to speak with Colin Phillips instead to get clarification.  Contact information # [phone]

## 2022-02-18 NOTE — Telephone Encounter (Signed)
Patient returned call in regards to Xanax dosing. Told him that 1 qd was correct. He said he had been on medication for years, he had just started a new job, and didn't think he could keep the job if he didn't have TID dosing. He said you mentioned possibly going to 1/2 tablets.   From June visit: We agreed to: Will continue Risperidone 4 mg daily at bedtime To continue Lamictal 200 mg daily To continue Zoloft 50 mg daily To continue Xanax 1 mg three times daily for severe anxiety Will report worsening symptoms or side effects  Please advise.

## 2022-02-18 NOTE — Telephone Encounter (Signed)
New RX of 0.5 mg TID pended to [facility].

## 2022-02-18 NOTE — Telephone Encounter (Signed)
He used to see CC. I simply was going by what CC had been writing the last three scripts prior to me. Looks like he was receiving #30 tablet. I will agree for him to try Xanax 0.5 mg three times daily. See if this is agreeable to him.

## 2022-02-19 MED ORDER — ALPRAZOLAM 0.5 MG PO TABS: 0.5000 mg | ORAL_TABLET | Freq: Three times a day (TID) | ORAL | 0 refills | Status: AC

## 2022-03-09 ENCOUNTER — Other Ambulatory Visit: Payer: Self-pay | Admitting: Psychiatry

## 2022-03-09 DIAGNOSIS — F411 Generalized anxiety disorder: Secondary | ICD-10-CM

## 2022-03-09 DIAGNOSIS — F410 Panic disorder [episodic paroxysmal anxiety] without agoraphobia: Secondary | ICD-10-CM

## 2022-05-19 ENCOUNTER — Ambulatory Visit: Payer: 59 | Admitting: Behavioral Health

## 2022-12-23 DIAGNOSIS — Z5181 Encounter for therapeutic drug level monitoring: Secondary | ICD-10-CM | POA: Diagnosis not present

## 2022-12-23 DIAGNOSIS — E559 Vitamin D deficiency, unspecified: Secondary | ICD-10-CM | POA: Diagnosis not present

## 2022-12-23 DIAGNOSIS — F319 Bipolar disorder, unspecified: Secondary | ICD-10-CM | POA: Diagnosis not present

## 2022-12-23 DIAGNOSIS — F411 Generalized anxiety disorder: Secondary | ICD-10-CM | POA: Diagnosis not present

## 2023-02-12 DIAGNOSIS — Z713 Dietary counseling and surveillance: Secondary | ICD-10-CM | POA: Diagnosis not present

## 2023-02-12 DIAGNOSIS — Z6829 Body mass index (BMI) 29.0-29.9, adult: Secondary | ICD-10-CM | POA: Diagnosis not present

## 2023-02-12 DIAGNOSIS — F17200 Nicotine dependence, unspecified, uncomplicated: Secondary | ICD-10-CM | POA: Diagnosis not present

## 2023-02-12 DIAGNOSIS — F17210 Nicotine dependence, cigarettes, uncomplicated: Secondary | ICD-10-CM | POA: Diagnosis not present

## 2023-02-12 DIAGNOSIS — J209 Acute bronchitis, unspecified: Secondary | ICD-10-CM | POA: Diagnosis not present

## 2023-05-19 DIAGNOSIS — F411 Generalized anxiety disorder: Secondary | ICD-10-CM | POA: Diagnosis not present

## 2023-05-19 DIAGNOSIS — F319 Bipolar disorder, unspecified: Secondary | ICD-10-CM | POA: Diagnosis not present

## 2023-11-03 DIAGNOSIS — F411 Generalized anxiety disorder: Secondary | ICD-10-CM | POA: Diagnosis not present

## 2023-11-03 DIAGNOSIS — F319 Bipolar disorder, unspecified: Secondary | ICD-10-CM | POA: Diagnosis not present

## 2023-11-10 DIAGNOSIS — E782 Mixed hyperlipidemia: Secondary | ICD-10-CM | POA: Diagnosis not present

## 2023-11-18 ENCOUNTER — Ambulatory Visit
Admission: EM | Admit: 2023-11-18 | Discharge: 2023-11-18 | Disposition: A | Discharge status: Home / Self Care | Attending: Nurse Practitioner | Admitting: Nurse Practitioner

## 2023-11-18 DIAGNOSIS — R590 Localized enlarged lymph nodes: Secondary | ICD-10-CM | POA: Diagnosis not present

## 2023-11-18 DIAGNOSIS — J029 Acute pharyngitis, unspecified: Secondary | ICD-10-CM

## 2023-11-18 DIAGNOSIS — Z20818 Contact with and (suspected) exposure to other bacterial communicable diseases: Secondary | ICD-10-CM | POA: Diagnosis not present

## 2023-11-18 MED ORDER — AMOXICILLIN 500 MG PO CAPS: 500.0000 mg | ORAL_CAPSULE | Freq: Two times a day (BID) | ORAL | 0 refills | Status: AC

## 2023-11-18 NOTE — ED Provider Notes (Signed)
 [facility]    CSN: [id number] Arrival date & time: 11/18/23  1340      History   Chief Complaint Chief Complaint  Patient presents with   Sore Throat    HPI Colin Phillips is a 48 y.o. male.   Patient presents today with 1 day history of frequent throat clearing, runny nose, postnasal drainage, sore throat, headache, and fatigue.  He denies fever, body aches or chills, shortness of breath or chest pain, abdominal pain, nausea/vomiting, diarrhea, and significantly decreased appetite.  Has taken ibuprofen which did seem to help with the symptoms.  Reports his stepdaughter was diagnosed and treated for strep throat earlier this week.    Past Medical History:  Diagnosis Date   Bipolar 1 disorder (HCC)    Bronchitis    Chronic back pain    Depression with anxiety    Multiple sclerosis (HCC)    Nausea     Patient Active Problem List   Diagnosis Date Noted   Fatigue 05/18/2016   Paresthesia 05/18/2016   Multiple sclerosis (HCC)    Bipolar 1 disorder (HCC)    Chronic back pain    Depression with anxiety    GERD 01/14/2007    Past Surgical History:  Procedure Laterality Date   COLONOSCOPY  09/27/2019   None     UPPER GASTROINTESTINAL ENDOSCOPY  09/27/2019       Home Medications    Prior to Admission medications   Medication Sig Start Date End Date Taking? Authorizing Provider  amoxicillin (AMOXIL) 500 MG capsule Take 1 capsule (500 mg total) by mouth 2 (two) times daily for 10 days. 11/18/23 11/28/23 Yes Wilhemena Harbour, NP  ALPRAZolam  (XANAX ) 0.5 MG tablet Take 1 tablet (0.5 mg total) by mouth 3 (three) times daily. Patient taking differently: Take 1 mg by mouth 3 (three) times daily. 02/19/22   Lincoln Renshaw, NP  ascorbic acid (VITAMIN C) 500 MG tablet Take 500 mg by mouth daily. Patient not taking: Reported on 11/18/2023    [provider]  benzonatate  (TESSALON ) 100 MG capsule Take 1 capsule (100 mg total) by mouth every 8 (eight)  hours. Patient not taking: Reported on 11/18/2023 08/22/21   Graham, Laura E, PA-C  Cholecalciferol (VITAMIN D3) 2000 units TABS Take 1 capsule by mouth daily.  Patient not taking: Reported on 11/18/2023    [provider]  lamoTRIgine  (LAMICTAL ) 200 MG tablet Take 1 tablet (200 mg total) by mouth daily. 01/29/22   Lincoln Renshaw, NP  methocarbamol (ROBAXIN) 750 MG tablet Take 750 mg by mouth 3 (three) times daily. Patient not taking: Reported on 11/18/2023 06/10/21   [provider]  predniSONE  (STERAPRED UNI-PAK 21 TAB) 10 MG (21) TBPK tablet Take by mouth daily. Take 6 tabs by mouth daily  for 2 days, then 5 tabs for 2 days, then 4 tabs for 2 days, then 3 tabs for 2 days, 2 tabs for 2 days, then 1 tab by mouth daily for 2 days Patient not taking: Reported on 11/18/2023 08/22/21   Aloysius Janus, PA-C  risperidone  (RISPERDAL ) 4 MG tablet Take 1 tablet (4 mg total) by mouth at bedtime. 01/29/22   Lincoln Renshaw, NP  sertraline  (ZOLOFT ) 50 MG tablet Take 1 tablet (50 mg total) by mouth daily. 01/29/22   Lincoln Renshaw, NP  sildenafil (VIAGRA) 50 MG tablet SMARTSIG:1-2 Tablet(s) By Mouth 09/08/21   [provider]  valACYclovir (VALTREX) 1000 MG tablet Take 1,000 mg by mouth  3 (three) times daily. Patient not taking: Reported on 11/18/2023 04/29/21   [provider]    Family History Family History  Problem Relation Age of Onset   Cancer Mother    Alcohol abuse Father    Alcohol abuse Brother    Cancer Cousin    Cancer Other        grandfather   Colon cancer Neg Hx    Esophageal cancer Neg Hx    Rectal cancer Neg Hx    Stomach cancer Neg Hx     Social History Social History   Tobacco Use   Smoking status: Every Day    Types: Cigars    Passive exposure: Current   Smokeless tobacco: Never  Vaping Use   Vaping status: Never Used  Substance Use Topics   Alcohol use: Yes    Comment: light social   Drug use: No     Allergies   Patient has no known  allergies.   Review of Systems Review of Systems Per HPI  Physical Exam Triage Vital Signs ED Triage Vitals [11/18/23 1419]  Encounter Vitals Group     BP 121/78     Girls Systolic BP Percentile      Girls Diastolic BP Percentile      Boys Systolic BP Percentile      Boys Diastolic BP Percentile      Pulse Rate 94     Resp 19     Temp 98.6 F (37 C)     Temp Source Oral     SpO2 97 %     Weight      Height      Head Circumference      Peak Flow      Pain Score 6     Pain Loc      Pain Education      Exclude from Growth Chart    No data found.  Updated Vital Signs BP 121/78 (BP Location: Right Arm)   Pulse 94   Temp 98.6 F (37 C) (Oral)   Resp 19   SpO2 97%   Visual Acuity Right Eye Distance:   Left Eye Distance:   Bilateral Distance:    Right Eye Near:   Left Eye Near:    Bilateral Near:     Physical Exam Vitals and nursing note reviewed.  Constitutional:      General: He is not in acute distress.    Appearance: He is well-developed. He is not ill-appearing, toxic-appearing or diaphoretic.  HENT:     Head: Normocephalic and atraumatic.     Right Ear: Tympanic membrane and ear canal normal. No drainage, swelling or tenderness. No middle ear effusion. Tympanic membrane is not erythematous.     Left Ear: Tympanic membrane and ear canal normal. No drainage, swelling or tenderness.  No middle ear effusion. Tympanic membrane is not erythematous.     Nose: No congestion or rhinorrhea.     Mouth/Throat:     Mouth: Mucous membranes are dry.     Pharynx: Posterior oropharyngeal erythema present. No uvula swelling.     Tonsils: No tonsillar exudate or tonsillar abscesses. 1+ on the right. 1+ on the left.   Eyes:     Conjunctiva/sclera: Conjunctivae normal.    Cardiovascular:     Rate and Rhythm: Tachycardia present.     Heart sounds: Normal heart sounds. No murmur heard. Pulmonary:     Effort: Pulmonary effort is normal. No respiratory distress.  Breath sounds: Normal breath sounds. No wheezing, rhonchi or rales.   Musculoskeletal:     Cervical back: Neck supple.  Lymphadenopathy:     Cervical: Cervical adenopathy present.   Skin:    General: Skin is warm and dry.     Coloration: Skin is not pale.     Findings: No erythema or rash.   Neurological:     Mental Status: He is alert and oriented to person, place, and time.      UC Treatments / Results  Labs (all labs ordered are listed, but only abnormal results are displayed) Labs Reviewed - No data to display  EKG   Radiology No results found.  Procedures Procedures (including critical care time)  Medications Ordered in UC Medications - No data to display  Initial Impression / Assessment and Plan / UC Course  I have reviewed the triage vital signs and the nursing notes.  Pertinent labs & imaging results that were available during my care of the patient were reviewed by me and considered in my medical decision making (see chart for details).   Patient is well-appearing, normotensive, afebrile, not tachycardic, not tachypneic, oxygenating well on room air.   1. Acute pharyngitis, unspecified etiology 2. Cervical lymphadenopathy 3. Exposure to strep throat Strep throat test deferred given symptoms and exposure Will treat with amoxicillin twice daily for 10 days; change toothbrush after starting treatment Supportive care discussed Work excuse provided   The patient was given the opportunity to ask questions.  All questions answered to their satisfaction.  The patient is in agreement to this plan.   Final Clinical Impressions(s) / UC Diagnoses   Final diagnoses:  Acute pharyngitis, unspecified etiology  Cervical lymphadenopathy  Exposure to strep throat     Discharge Instructions      As we discussed, we are treating you for strep throat today since you have been exposed and since your symptoms are consistent with strep throat.  Take the amoxicillin  twice daily for 10 days as prescribed to treat it.  Change her toothbrush today or tomorrow to prevent reinfection.  Avoid sharing drinks or kissing others until you have been on the antibiotic for 24 hours.  Seek care symptoms worsen despite treatment.     ED Prescriptions     Medication Sig Dispense Auth. Provider   amoxicillin (AMOXIL) 500 MG capsule Take 1 capsule (500 mg total) by mouth 2 (two) times daily for 10 days. 20 capsule Wilhemena Harbour, NP      PDMP not reviewed this encounter.   Wilhemena Harbour, NP 11/18/23 1521

## 2023-11-18 NOTE — ED Triage Notes (Signed)
 Pt being seen in UC for sore throat that began last night, HA, and sinus congestion.  Pt step daughter recent dx with strep throat. Pt denies fevers.

## 2023-11-18 NOTE — Discharge Instructions (Signed)
 As we discussed, we are treating you for strep throat today since you have been exposed and since your symptoms are consistent with strep throat.  Take the amoxicillin twice daily for 10 days as prescribed to treat it.  Change her toothbrush today or tomorrow to prevent reinfection.  Avoid sharing drinks or kissing others until you have been on the antibiotic for 24 hours.  Seek care symptoms worsen despite treatment.

## 2024-05-03 DIAGNOSIS — F3131 Bipolar disorder, current episode depressed, mild: Secondary | ICD-10-CM | POA: Diagnosis not present

## 2024-05-03 DIAGNOSIS — F411 Generalized anxiety disorder: Secondary | ICD-10-CM | POA: Diagnosis not present

## 2024-05-03 DIAGNOSIS — F900 Attention-deficit hyperactivity disorder, predominantly inattentive type: Secondary | ICD-10-CM | POA: Diagnosis not present
